# Patient Record
Sex: Female | Born: 1945
Health system: Southern US, Community
[De-identification: ages and names within clinical notes are randomized; demographics above are authoritative.]

## PROBLEM LIST (undated history)

## (undated) DIAGNOSIS — F419 Anxiety disorder, unspecified: Secondary | ICD-10-CM

## (undated) DIAGNOSIS — A150 Tuberculosis of lung: Secondary | ICD-10-CM

## (undated) HISTORY — PX: BREAST BIOPSY: SHX20

## (undated) HISTORY — PX: TOE SURGERY: SHX1073

## (undated) HISTORY — PX: COLONOSCOPY: SHX174

## (undated) HISTORY — PX: BREAST EXCISIONAL BIOPSY: SUR124

## (undated) HISTORY — DX: Tuberculosis of lung: A15.0

## (undated) HISTORY — PX: DILATION AND CURETTAGE OF UTERUS: SHX78

---

## 1999-07-06 ENCOUNTER — Other Ambulatory Visit: Admission: RE | Admit: 1999-07-06 | Discharge: 1999-07-06 | Payer: Self-pay | Admitting: Obstetrics and Gynecology

## 2000-07-10 ENCOUNTER — Other Ambulatory Visit: Admission: RE | Admit: 2000-07-10 | Discharge: 2000-07-10 | Payer: Self-pay | Admitting: Obstetrics and Gynecology

## 2000-07-20 ENCOUNTER — Encounter: Payer: Self-pay | Admitting: *Deleted

## 2000-07-20 ENCOUNTER — Ambulatory Visit: Admission: RE | Admit: 2000-07-20 | Discharge: 2000-07-20 | Payer: Self-pay | Admitting: *Deleted

## 2001-09-06 ENCOUNTER — Other Ambulatory Visit: Admission: RE | Admit: 2001-09-06 | Discharge: 2001-09-06 | Payer: Self-pay | Admitting: Obstetrics and Gynecology

## 2001-09-11 ENCOUNTER — Encounter: Payer: Self-pay | Admitting: Obstetrics and Gynecology

## 2001-09-11 ENCOUNTER — Ambulatory Visit (HOSPITAL_COMMUNITY): Admission: RE | Admit: 2001-09-11 | Discharge: 2001-09-11 | Payer: Self-pay | Admitting: Obstetrics and Gynecology

## 2001-09-11 ENCOUNTER — Encounter: Admission: RE | Admit: 2001-09-11 | Discharge: 2001-09-11 | Payer: Self-pay | Admitting: Obstetrics and Gynecology

## 2003-06-25 ENCOUNTER — Emergency Department (HOSPITAL_COMMUNITY): Admission: EM | Admit: 2003-06-25 | Discharge: 2003-06-26 | Payer: Self-pay | Admitting: Emergency Medicine

## 2003-09-03 ENCOUNTER — Ambulatory Visit (HOSPITAL_COMMUNITY): Admission: RE | Admit: 2003-09-03 | Discharge: 2003-09-03 | Payer: Self-pay | Admitting: *Deleted

## 2003-09-03 ENCOUNTER — Encounter (INDEPENDENT_AMBULATORY_CARE_PROVIDER_SITE_OTHER): Payer: Self-pay | Admitting: Specialist

## 2007-06-06 ENCOUNTER — Encounter: Admission: RE | Admit: 2007-06-06 | Discharge: 2007-06-06 | Payer: Self-pay | Admitting: Obstetrics and Gynecology

## 2007-06-06 ENCOUNTER — Encounter (INDEPENDENT_AMBULATORY_CARE_PROVIDER_SITE_OTHER): Payer: Self-pay | Admitting: Diagnostic Radiology

## 2008-06-05 ENCOUNTER — Encounter: Admission: RE | Admit: 2008-06-05 | Discharge: 2008-06-05 | Payer: Self-pay | Admitting: Obstetrics and Gynecology

## 2009-08-25 ENCOUNTER — Encounter: Admission: RE | Admit: 2009-08-25 | Discharge: 2009-08-25 | Payer: Self-pay | Admitting: Obstetrics and Gynecology

## 2009-11-23 ENCOUNTER — Ambulatory Visit (HOSPITAL_COMMUNITY): Admission: RE | Admit: 2009-11-23 | Discharge: 2009-11-23 | Payer: Self-pay | Admitting: Neurological Surgery

## 2010-09-27 ENCOUNTER — Encounter
Admission: RE | Admit: 2010-09-27 | Discharge: 2010-09-27 | Payer: Self-pay | Source: Home / Self Care | Attending: Obstetrics and Gynecology | Admitting: Obstetrics and Gynecology

## 2011-01-06 LAB — CBC
MCV: 91.2 fL (ref 78.0–100.0)
RBC: 4.02 MIL/uL (ref 3.87–5.11)
WBC: 4.4 10*3/uL (ref 4.0–10.5)

## 2011-09-16 ENCOUNTER — Other Ambulatory Visit: Payer: Self-pay | Admitting: Obstetrics and Gynecology

## 2011-09-16 DIAGNOSIS — Z1231 Encounter for screening mammogram for malignant neoplasm of breast: Secondary | ICD-10-CM

## 2011-11-29 ENCOUNTER — Other Ambulatory Visit: Payer: Self-pay | Admitting: Dermatology

## 2011-11-29 DIAGNOSIS — L851 Acquired keratosis [keratoderma] palmaris et plantaris: Secondary | ICD-10-CM | POA: Diagnosis not present

## 2011-11-29 DIAGNOSIS — L821 Other seborrheic keratosis: Secondary | ICD-10-CM | POA: Diagnosis not present

## 2011-11-29 DIAGNOSIS — L570 Actinic keratosis: Secondary | ICD-10-CM | POA: Diagnosis not present

## 2011-11-29 DIAGNOSIS — D485 Neoplasm of uncertain behavior of skin: Secondary | ICD-10-CM | POA: Diagnosis not present

## 2011-12-16 ENCOUNTER — Other Ambulatory Visit: Payer: Self-pay | Admitting: Obstetrics and Gynecology

## 2011-12-16 DIAGNOSIS — Z780 Asymptomatic menopausal state: Secondary | ICD-10-CM

## 2011-12-30 ENCOUNTER — Other Ambulatory Visit: Payer: Self-pay

## 2011-12-30 ENCOUNTER — Ambulatory Visit: Payer: Self-pay

## 2012-01-09 ENCOUNTER — Ambulatory Visit
Admission: RE | Admit: 2012-01-09 | Discharge: 2012-01-09 | Disposition: A | Discharge status: Home / Self Care | Payer: Medicare Other | Source: Ambulatory Visit | Attending: Obstetrics and Gynecology | Admitting: Obstetrics and Gynecology

## 2012-01-09 DIAGNOSIS — Z1231 Encounter for screening mammogram for malignant neoplasm of breast: Secondary | ICD-10-CM

## 2012-01-09 DIAGNOSIS — Z780 Asymptomatic menopausal state: Secondary | ICD-10-CM

## 2012-01-09 DIAGNOSIS — Z13820 Encounter for screening for osteoporosis: Secondary | ICD-10-CM | POA: Diagnosis not present

## 2012-03-09 DIAGNOSIS — E7800 Pure hypercholesterolemia, unspecified: Secondary | ICD-10-CM | POA: Diagnosis not present

## 2012-03-09 DIAGNOSIS — F329 Major depressive disorder, single episode, unspecified: Secondary | ICD-10-CM | POA: Diagnosis not present

## 2012-03-09 DIAGNOSIS — F411 Generalized anxiety disorder: Secondary | ICD-10-CM | POA: Diagnosis not present

## 2012-03-16 DIAGNOSIS — H524 Presbyopia: Secondary | ICD-10-CM | POA: Diagnosis not present

## 2012-03-16 DIAGNOSIS — H40009 Preglaucoma, unspecified, unspecified eye: Secondary | ICD-10-CM | POA: Diagnosis not present

## 2012-09-10 DIAGNOSIS — Z209 Contact with and (suspected) exposure to unspecified communicable disease: Secondary | ICD-10-CM | POA: Diagnosis not present

## 2012-09-10 DIAGNOSIS — W278XXA Contact with other nonpowered hand tool, initial encounter: Secondary | ICD-10-CM | POA: Diagnosis not present

## 2012-09-10 DIAGNOSIS — Z23 Encounter for immunization: Secondary | ICD-10-CM | POA: Diagnosis not present

## 2013-01-22 DIAGNOSIS — F411 Generalized anxiety disorder: Secondary | ICD-10-CM | POA: Diagnosis not present

## 2013-01-22 DIAGNOSIS — Z209 Contact with and (suspected) exposure to unspecified communicable disease: Secondary | ICD-10-CM | POA: Diagnosis not present

## 2013-01-22 DIAGNOSIS — Z0000 Encounter for general adult medical examination without abnormal findings: Secondary | ICD-10-CM | POA: Diagnosis not present

## 2013-01-22 DIAGNOSIS — F329 Major depressive disorder, single episode, unspecified: Secondary | ICD-10-CM | POA: Diagnosis not present

## 2013-01-22 DIAGNOSIS — E785 Hyperlipidemia, unspecified: Secondary | ICD-10-CM | POA: Diagnosis not present

## 2013-01-22 DIAGNOSIS — F3289 Other specified depressive episodes: Secondary | ICD-10-CM | POA: Diagnosis not present

## 2013-02-25 ENCOUNTER — Other Ambulatory Visit: Payer: Self-pay

## 2013-02-25 DIAGNOSIS — Z1231 Encounter for screening mammogram for malignant neoplasm of breast: Secondary | ICD-10-CM

## 2013-03-20 DIAGNOSIS — H524 Presbyopia: Secondary | ICD-10-CM | POA: Diagnosis not present

## 2013-03-20 DIAGNOSIS — H40009 Preglaucoma, unspecified, unspecified eye: Secondary | ICD-10-CM | POA: Diagnosis not present

## 2013-03-25 DIAGNOSIS — F329 Major depressive disorder, single episode, unspecified: Secondary | ICD-10-CM | POA: Diagnosis not present

## 2013-03-25 DIAGNOSIS — Z23 Encounter for immunization: Secondary | ICD-10-CM | POA: Diagnosis not present

## 2013-03-25 DIAGNOSIS — E7800 Pure hypercholesterolemia, unspecified: Secondary | ICD-10-CM | POA: Diagnosis not present

## 2013-03-25 DIAGNOSIS — Z209 Contact with and (suspected) exposure to unspecified communicable disease: Secondary | ICD-10-CM | POA: Diagnosis not present

## 2013-03-29 ENCOUNTER — Ambulatory Visit
Admission: RE | Admit: 2013-03-29 | Discharge: 2013-03-29 | Disposition: A | Discharge status: Home / Self Care | Payer: Medicare Other | Source: Ambulatory Visit

## 2013-03-29 DIAGNOSIS — Z1231 Encounter for screening mammogram for malignant neoplasm of breast: Secondary | ICD-10-CM

## 2013-04-02 ENCOUNTER — Other Ambulatory Visit: Payer: Self-pay | Admitting: Obstetrics and Gynecology

## 2013-04-02 DIAGNOSIS — R928 Other abnormal and inconclusive findings on diagnostic imaging of breast: Secondary | ICD-10-CM

## 2013-04-10 ENCOUNTER — Ambulatory Visit
Admission: RE | Admit: 2013-04-10 | Discharge: 2013-04-10 | Disposition: A | Discharge status: Home / Self Care | Payer: Medicare Other | Source: Ambulatory Visit | Attending: Obstetrics and Gynecology | Admitting: Obstetrics and Gynecology

## 2013-04-10 DIAGNOSIS — R928 Other abnormal and inconclusive findings on diagnostic imaging of breast: Secondary | ICD-10-CM | POA: Diagnosis not present

## 2013-07-30 DIAGNOSIS — L820 Inflamed seborrheic keratosis: Secondary | ICD-10-CM | POA: Diagnosis not present

## 2013-07-30 DIAGNOSIS — D1801 Hemangioma of skin and subcutaneous tissue: Secondary | ICD-10-CM | POA: Diagnosis not present

## 2013-07-30 DIAGNOSIS — L821 Other seborrheic keratosis: Secondary | ICD-10-CM | POA: Diagnosis not present

## 2013-07-30 DIAGNOSIS — L570 Actinic keratosis: Secondary | ICD-10-CM | POA: Diagnosis not present

## 2013-07-30 DIAGNOSIS — L723 Sebaceous cyst: Secondary | ICD-10-CM | POA: Diagnosis not present

## 2013-09-05 DIAGNOSIS — Z0000 Encounter for general adult medical examination without abnormal findings: Secondary | ICD-10-CM | POA: Diagnosis not present

## 2013-09-05 DIAGNOSIS — Z124 Encounter for screening for malignant neoplasm of cervix: Secondary | ICD-10-CM | POA: Diagnosis not present

## 2013-09-05 DIAGNOSIS — Z01419 Encounter for gynecological examination (general) (routine) without abnormal findings: Secondary | ICD-10-CM | POA: Diagnosis not present

## 2013-11-19 ENCOUNTER — Other Ambulatory Visit: Payer: Self-pay | Admitting: Gastroenterology

## 2013-11-19 DIAGNOSIS — Z86010 Personal history of colonic polyps: Secondary | ICD-10-CM | POA: Diagnosis not present

## 2013-11-19 DIAGNOSIS — D126 Benign neoplasm of colon, unspecified: Secondary | ICD-10-CM | POA: Diagnosis not present

## 2013-11-19 DIAGNOSIS — Z09 Encounter for follow-up examination after completed treatment for conditions other than malignant neoplasm: Secondary | ICD-10-CM | POA: Diagnosis not present

## 2013-11-19 DIAGNOSIS — K648 Other hemorrhoids: Secondary | ICD-10-CM | POA: Diagnosis not present

## 2014-01-28 DIAGNOSIS — M2240 Chondromalacia patellae, unspecified knee: Secondary | ICD-10-CM | POA: Diagnosis not present

## 2014-02-05 DIAGNOSIS — Z1331 Encounter for screening for depression: Secondary | ICD-10-CM | POA: Diagnosis not present

## 2014-02-05 DIAGNOSIS — Z0000 Encounter for general adult medical examination without abnormal findings: Secondary | ICD-10-CM | POA: Diagnosis not present

## 2014-02-05 DIAGNOSIS — F411 Generalized anxiety disorder: Secondary | ICD-10-CM | POA: Diagnosis not present

## 2014-02-05 DIAGNOSIS — E785 Hyperlipidemia, unspecified: Secondary | ICD-10-CM | POA: Diagnosis not present

## 2014-03-20 DIAGNOSIS — H2510 Age-related nuclear cataract, unspecified eye: Secondary | ICD-10-CM | POA: Diagnosis not present

## 2014-05-07 DIAGNOSIS — H9319 Tinnitus, unspecified ear: Secondary | ICD-10-CM | POA: Diagnosis not present

## 2014-05-07 DIAGNOSIS — H60339 Swimmer's ear, unspecified ear: Secondary | ICD-10-CM | POA: Diagnosis not present

## 2014-06-05 ENCOUNTER — Other Ambulatory Visit: Payer: Self-pay

## 2014-06-05 DIAGNOSIS — Z1231 Encounter for screening mammogram for malignant neoplasm of breast: Secondary | ICD-10-CM

## 2014-06-11 DIAGNOSIS — T6391XA Toxic effect of contact with unspecified venomous animal, accidental (unintentional), initial encounter: Secondary | ICD-10-CM | POA: Diagnosis not present

## 2014-06-11 DIAGNOSIS — H608X9 Other otitis externa, unspecified ear: Secondary | ICD-10-CM | POA: Diagnosis not present

## 2014-06-11 DIAGNOSIS — H9319 Tinnitus, unspecified ear: Secondary | ICD-10-CM | POA: Diagnosis not present

## 2014-06-12 ENCOUNTER — Ambulatory Visit
Admission: RE | Admit: 2014-06-12 | Discharge: 2014-06-12 | Disposition: A | Discharge status: Home / Self Care | Payer: Medicare Other | Source: Ambulatory Visit

## 2014-06-12 DIAGNOSIS — Z1231 Encounter for screening mammogram for malignant neoplasm of breast: Secondary | ICD-10-CM

## 2014-07-24 DIAGNOSIS — M25511 Pain in right shoulder: Secondary | ICD-10-CM | POA: Diagnosis not present

## 2014-07-24 DIAGNOSIS — M5010 Cervical disc disorder with radiculopathy, unspecified cervical region: Secondary | ICD-10-CM | POA: Diagnosis not present

## 2014-08-04 DIAGNOSIS — M47812 Spondylosis without myelopathy or radiculopathy, cervical region: Secondary | ICD-10-CM | POA: Diagnosis not present

## 2014-08-06 DIAGNOSIS — M5010 Cervical disc disorder with radiculopathy, unspecified cervical region: Secondary | ICD-10-CM | POA: Diagnosis not present

## 2014-08-06 DIAGNOSIS — M25511 Pain in right shoulder: Secondary | ICD-10-CM | POA: Diagnosis not present

## 2014-08-06 DIAGNOSIS — M47812 Spondylosis without myelopathy or radiculopathy, cervical region: Secondary | ICD-10-CM | POA: Diagnosis not present

## 2014-08-15 DIAGNOSIS — Z6825 Body mass index (BMI) 25.0-25.9, adult: Secondary | ICD-10-CM | POA: Diagnosis not present

## 2014-08-15 DIAGNOSIS — M542 Cervicalgia: Secondary | ICD-10-CM | POA: Diagnosis not present

## 2014-08-21 DIAGNOSIS — L821 Other seborrheic keratosis: Secondary | ICD-10-CM | POA: Diagnosis not present

## 2014-08-21 DIAGNOSIS — L570 Actinic keratosis: Secondary | ICD-10-CM | POA: Diagnosis not present

## 2014-08-21 DIAGNOSIS — D1801 Hemangioma of skin and subcutaneous tissue: Secondary | ICD-10-CM | POA: Diagnosis not present

## 2015-02-23 DIAGNOSIS — R51 Headache: Secondary | ICD-10-CM | POA: Diagnosis not present

## 2015-02-23 DIAGNOSIS — Z4802 Encounter for removal of sutures: Secondary | ICD-10-CM | POA: Diagnosis not present

## 2015-02-23 DIAGNOSIS — F419 Anxiety disorder, unspecified: Secondary | ICD-10-CM | POA: Diagnosis not present

## 2015-07-13 DIAGNOSIS — F419 Anxiety disorder, unspecified: Secondary | ICD-10-CM | POA: Diagnosis not present

## 2015-07-13 DIAGNOSIS — E780 Pure hypercholesterolemia: Secondary | ICD-10-CM | POA: Diagnosis not present

## 2015-07-13 DIAGNOSIS — Z0000 Encounter for general adult medical examination without abnormal findings: Secondary | ICD-10-CM | POA: Diagnosis not present

## 2015-07-13 DIAGNOSIS — Z1389 Encounter for screening for other disorder: Secondary | ICD-10-CM | POA: Diagnosis not present

## 2015-07-13 DIAGNOSIS — Z23 Encounter for immunization: Secondary | ICD-10-CM | POA: Diagnosis not present

## 2015-07-20 DIAGNOSIS — L904 Acrodermatitis chronica atrophicans: Secondary | ICD-10-CM | POA: Diagnosis not present

## 2015-07-20 DIAGNOSIS — N952 Postmenopausal atrophic vaginitis: Secondary | ICD-10-CM | POA: Diagnosis not present

## 2015-08-02 IMAGING — MG MM SCREENING BREAST TOMO BILATERAL
8 series · 9 of 24 positions shown · non-contrast
Comparison: Previous exam(s).

CLINICAL DATA: Screening.

EXAM:
DIGITAL SCREENING BILATERAL MAMMOGRAM WITH 3D TOMO WITH CAD

[L CC]
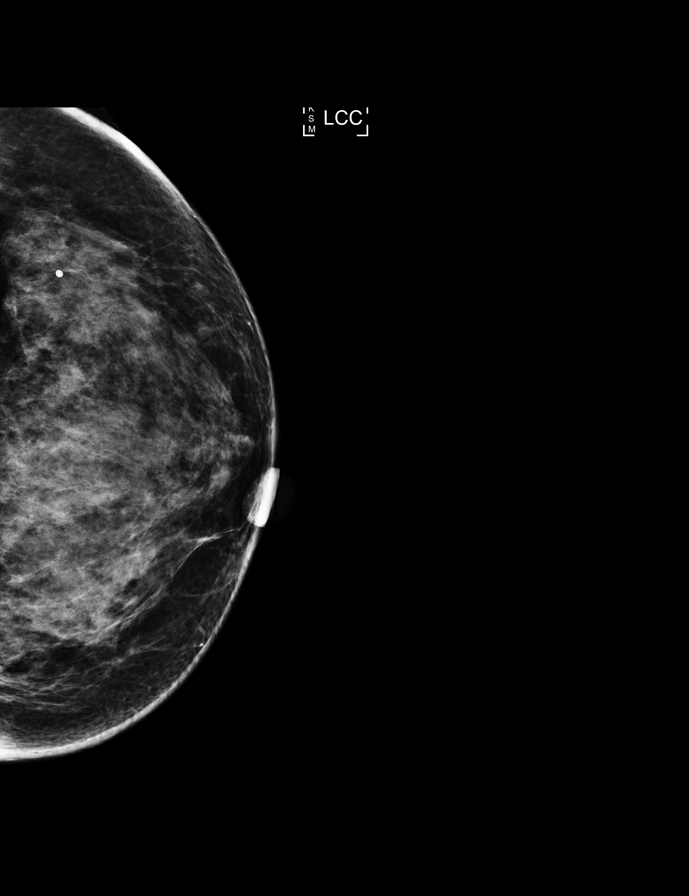

[R MLO]
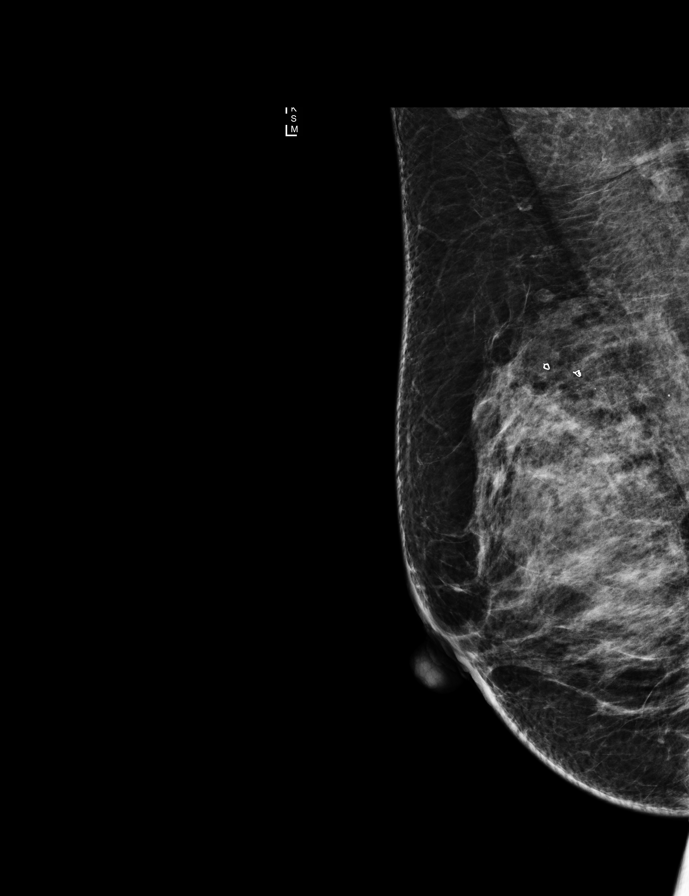

[L MLO]
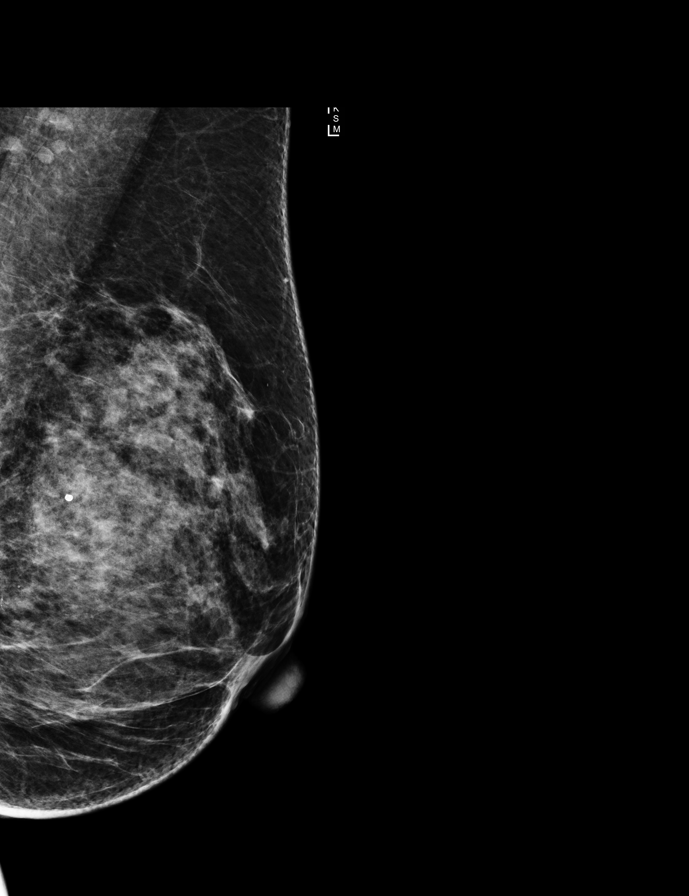

[R CC]
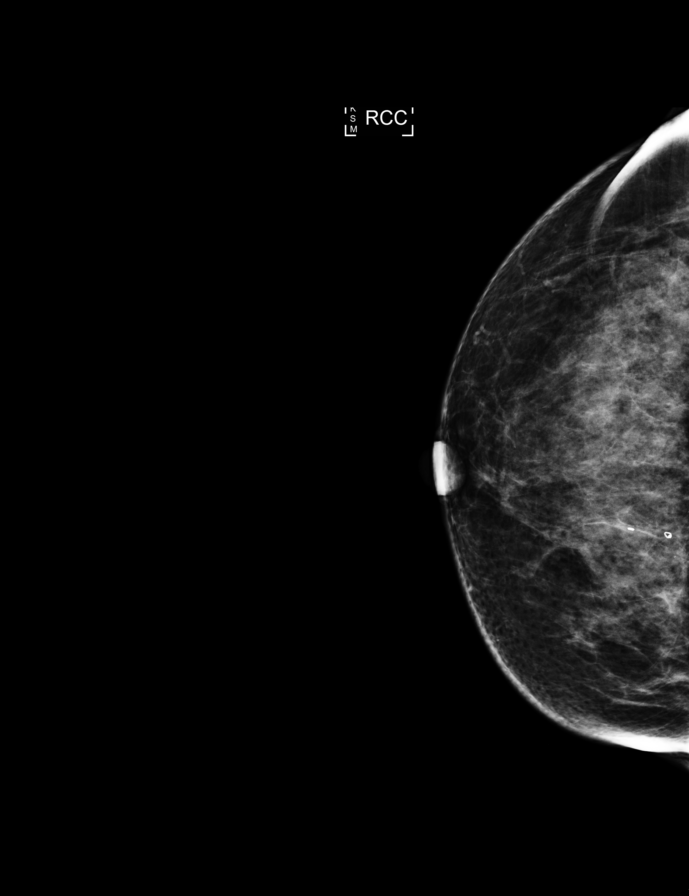

[R MLO tomo · 2 of 70 frames shown]
[frame 23/70]
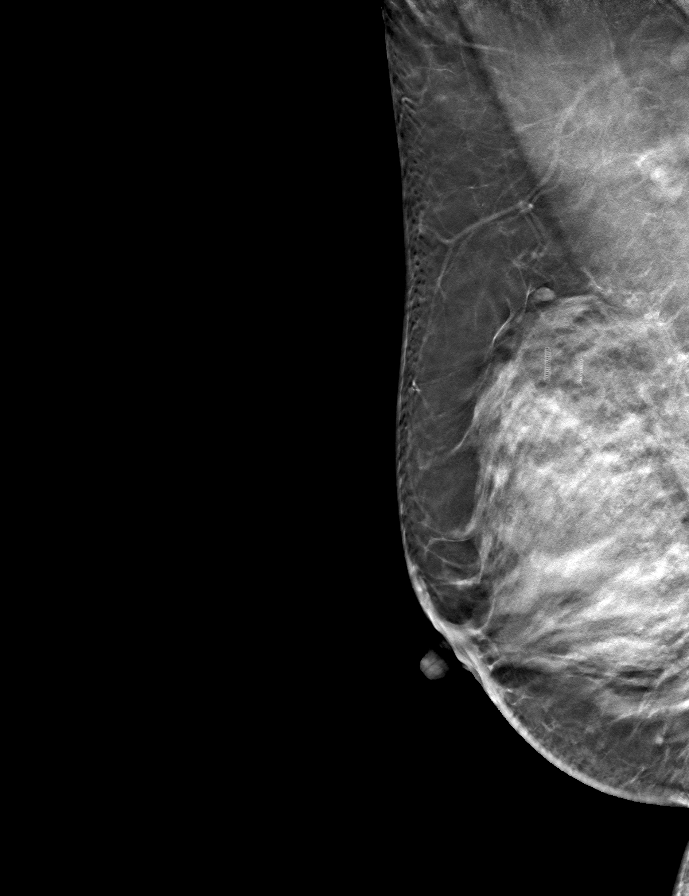
[frame 35/70]
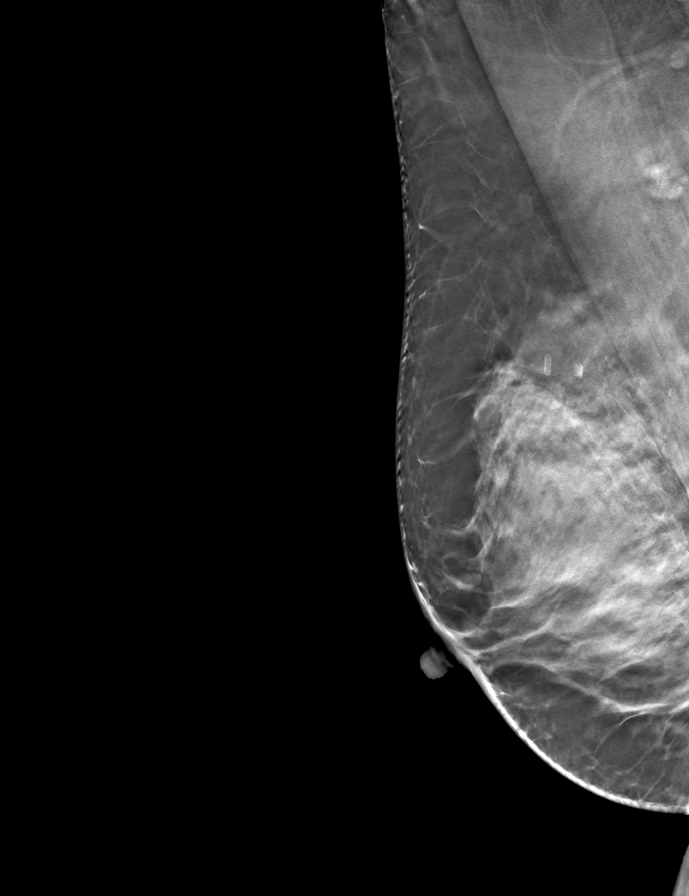

[R CC tomo · tomo slice 39/77.0]
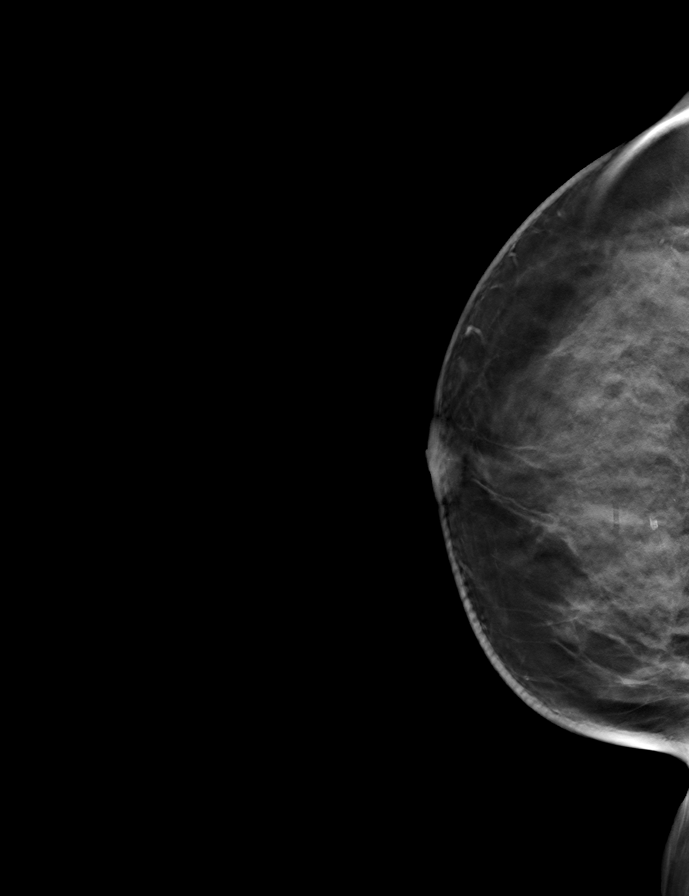

[L MLO tomo · tomo slice 33/66.0]
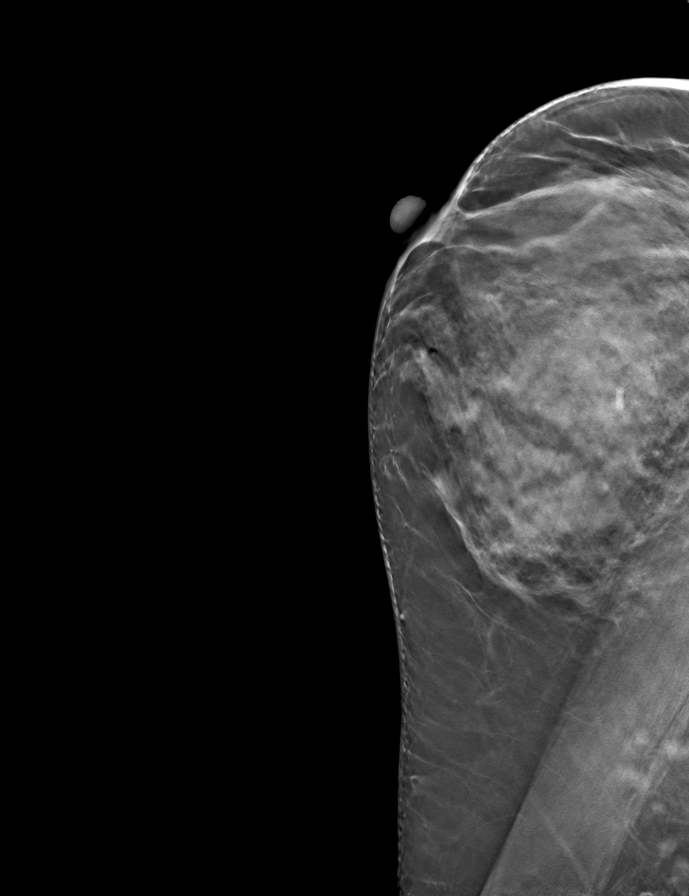

[L CC tomo · tomo slice 37/73.0]
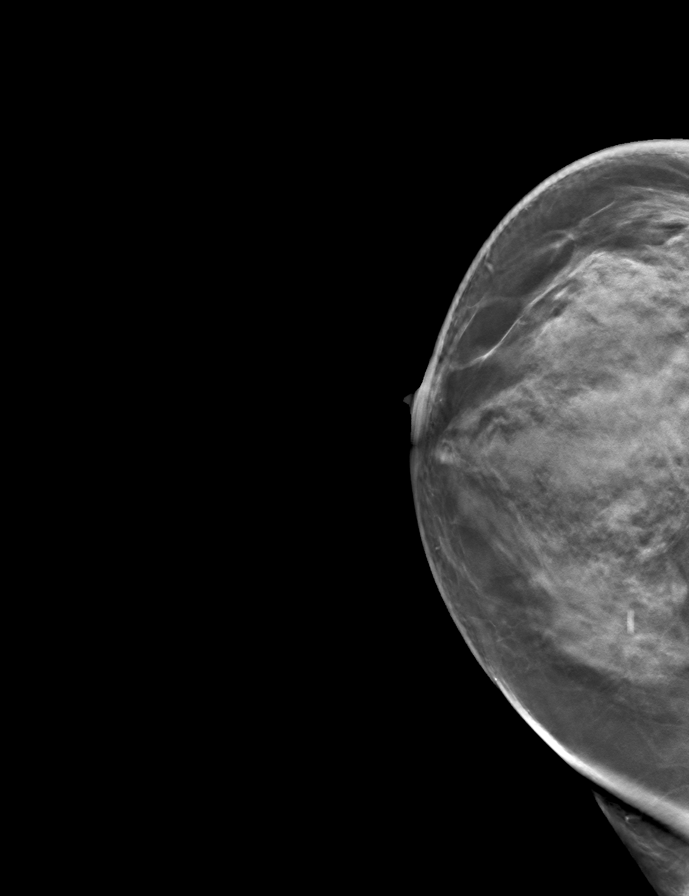

[9 of 24 positions shown; findings below may reference images not displayed]

ACR Breast Density Category d: The breast tissue is extremely dense,
which lowers the sensitivity of mammography.
FINDINGS: There are no findings suspicious for malignancy. Images were
processed with CAD.
IMPRESSION: No mammographic evidence of malignancy. A result letter of this
screening mammogram will be mailed directly to the patient.

RECOMMENDATION:
Screening mammogram in one year. (Code:JD-S-SN3)

BI-RADS CATEGORY  1: Negative.

## 2015-08-24 DIAGNOSIS — L821 Other seborrheic keratosis: Secondary | ICD-10-CM | POA: Diagnosis not present

## 2015-08-24 DIAGNOSIS — L570 Actinic keratosis: Secondary | ICD-10-CM | POA: Diagnosis not present

## 2015-08-24 DIAGNOSIS — D1801 Hemangioma of skin and subcutaneous tissue: Secondary | ICD-10-CM | POA: Diagnosis not present

## 2015-09-08 DIAGNOSIS — D649 Anemia, unspecified: Secondary | ICD-10-CM | POA: Diagnosis not present

## 2015-09-08 DIAGNOSIS — Z130 Encounter for screening for diseases of the blood and blood-forming organs and certain disorders involving the immune mechanism: Secondary | ICD-10-CM | POA: Diagnosis not present

## 2015-09-08 DIAGNOSIS — Z01419 Encounter for gynecological examination (general) (routine) without abnormal findings: Secondary | ICD-10-CM | POA: Diagnosis not present

## 2015-09-08 DIAGNOSIS — Z124 Encounter for screening for malignant neoplasm of cervix: Secondary | ICD-10-CM | POA: Diagnosis not present

## 2015-10-15 ENCOUNTER — Other Ambulatory Visit: Payer: Self-pay | Admitting: Family Medicine

## 2015-10-16 ENCOUNTER — Other Ambulatory Visit: Payer: Self-pay | Admitting: Family Medicine

## 2015-10-16 DIAGNOSIS — E2839 Other primary ovarian failure: Secondary | ICD-10-CM

## 2015-10-16 DIAGNOSIS — Z1231 Encounter for screening mammogram for malignant neoplasm of breast: Secondary | ICD-10-CM

## 2015-11-17 ENCOUNTER — Ambulatory Visit
Admission: RE | Admit: 2015-11-17 | Discharge: 2015-11-17 | Disposition: A | Discharge status: Home / Self Care | Payer: Medicare Other | Source: Ambulatory Visit | Attending: Family Medicine | Admitting: Family Medicine

## 2015-11-17 DIAGNOSIS — E2839 Other primary ovarian failure: Secondary | ICD-10-CM

## 2015-11-17 DIAGNOSIS — M8588 Other specified disorders of bone density and structure, other site: Secondary | ICD-10-CM | POA: Diagnosis not present

## 2015-11-17 DIAGNOSIS — Z1231 Encounter for screening mammogram for malignant neoplasm of breast: Secondary | ICD-10-CM

## 2015-12-09 DIAGNOSIS — L720 Epidermal cyst: Secondary | ICD-10-CM | POA: Diagnosis not present

## 2015-12-09 DIAGNOSIS — L2089 Other atopic dermatitis: Secondary | ICD-10-CM | POA: Diagnosis not present

## 2015-12-09 DIAGNOSIS — L821 Other seborrheic keratosis: Secondary | ICD-10-CM | POA: Diagnosis not present

## 2015-12-09 DIAGNOSIS — L570 Actinic keratosis: Secondary | ICD-10-CM | POA: Diagnosis not present

## 2016-01-25 DIAGNOSIS — F419 Anxiety disorder, unspecified: Secondary | ICD-10-CM | POA: Diagnosis not present

## 2016-01-25 DIAGNOSIS — A09 Infectious gastroenteritis and colitis, unspecified: Secondary | ICD-10-CM | POA: Diagnosis not present

## 2016-01-25 DIAGNOSIS — G4725 Circadian rhythm sleep disorder, jet lag type: Secondary | ICD-10-CM | POA: Diagnosis not present

## 2016-03-07 DIAGNOSIS — J069 Acute upper respiratory infection, unspecified: Secondary | ICD-10-CM | POA: Diagnosis not present

## 2016-03-18 DIAGNOSIS — H2513 Age-related nuclear cataract, bilateral: Secondary | ICD-10-CM | POA: Diagnosis not present

## 2016-03-18 DIAGNOSIS — H524 Presbyopia: Secondary | ICD-10-CM | POA: Diagnosis not present

## 2016-03-18 DIAGNOSIS — H5203 Hypermetropia, bilateral: Secondary | ICD-10-CM | POA: Diagnosis not present

## 2016-06-14 DIAGNOSIS — R194 Change in bowel habit: Secondary | ICD-10-CM | POA: Diagnosis not present

## 2016-06-14 DIAGNOSIS — K648 Other hemorrhoids: Secondary | ICD-10-CM | POA: Diagnosis not present

## 2016-07-04 DIAGNOSIS — M2241 Chondromalacia patellae, right knee: Secondary | ICD-10-CM | POA: Diagnosis not present

## 2016-07-04 DIAGNOSIS — M545 Low back pain: Secondary | ICD-10-CM | POA: Diagnosis not present

## 2016-07-04 DIAGNOSIS — M7061 Trochanteric bursitis, right hip: Secondary | ICD-10-CM | POA: Diagnosis not present

## 2016-08-23 DIAGNOSIS — B078 Other viral warts: Secondary | ICD-10-CM | POA: Diagnosis not present

## 2016-08-23 DIAGNOSIS — D1801 Hemangioma of skin and subcutaneous tissue: Secondary | ICD-10-CM | POA: Diagnosis not present

## 2016-08-23 DIAGNOSIS — L821 Other seborrheic keratosis: Secondary | ICD-10-CM | POA: Diagnosis not present

## 2016-09-12 DIAGNOSIS — Z23 Encounter for immunization: Secondary | ICD-10-CM | POA: Diagnosis not present

## 2016-09-12 DIAGNOSIS — F419 Anxiety disorder, unspecified: Secondary | ICD-10-CM | POA: Diagnosis not present

## 2016-09-12 DIAGNOSIS — E7800 Pure hypercholesterolemia, unspecified: Secondary | ICD-10-CM | POA: Diagnosis not present

## 2016-09-12 DIAGNOSIS — Z1389 Encounter for screening for other disorder: Secondary | ICD-10-CM | POA: Diagnosis not present

## 2016-09-12 DIAGNOSIS — Z0000 Encounter for general adult medical examination without abnormal findings: Secondary | ICD-10-CM | POA: Diagnosis not present

## 2016-09-21 DIAGNOSIS — B078 Other viral warts: Secondary | ICD-10-CM | POA: Diagnosis not present

## 2016-10-25 DIAGNOSIS — B078 Other viral warts: Secondary | ICD-10-CM | POA: Diagnosis not present

## 2016-11-22 ENCOUNTER — Ambulatory Visit (INDEPENDENT_AMBULATORY_CARE_PROVIDER_SITE_OTHER): Payer: Medicare Other

## 2016-11-22 ENCOUNTER — Encounter: Payer: Self-pay | Admitting: Podiatry

## 2016-11-22 ENCOUNTER — Ambulatory Visit (INDEPENDENT_AMBULATORY_CARE_PROVIDER_SITE_OTHER): Payer: Medicare Other | Admitting: Podiatry

## 2016-11-22 VITALS — BP 109/59 | HR 56 | Resp 16

## 2016-11-22 DIAGNOSIS — G5792 Unspecified mononeuropathy of left lower limb: Secondary | ICD-10-CM

## 2016-11-22 DIAGNOSIS — S9032XA Contusion of left foot, initial encounter: Secondary | ICD-10-CM | POA: Diagnosis not present

## 2016-11-22 DIAGNOSIS — M79672 Pain in left foot: Secondary | ICD-10-CM

## 2016-11-22 NOTE — Progress Notes (Signed)
   Subjective:    Patient ID: Maria Booth, female    DOB: 06-18-1946, 71 y.o.   MRN: [id number]  HPI: She presents today with chief complaint of pain to the fifth metatarsal base of the left foot. States that yesterday she fell asleep in the chair with her foot under her leg as she sat on it and when she went to stand up she noticed her foot was completely numb looked down and she was standing on the side of the foot. She states this at that time it has been somewhat numb but tender and painful on the fifth metatarsal base as she points to the left foot. Denies any other trauma falls.  Review of Systems  Musculoskeletal: Positive for gait problem.  All other systems reviewed and are negative.      Objective:   Physical Exam: Vital signs are stable alert and oriented 3. Pulses are palpable. Neurologic sensorium is intact. Deep tendon reflexes are intact. Muscle strength is normal. She is mild tenderness on palpation of the fifth metatarsal base. Radiographs do not demonstrate any type of osseus abnormalities and tendons appear to be intact with little or no tenderness on palpation.  Assessment: Contusion fifth metatarsal left foot.  Plan: Follow up with me with any other questions otherwise good supportive shoe gear will be necessary for the next few days.        Assessment & Plan:

## 2017-01-10 ENCOUNTER — Other Ambulatory Visit: Payer: Self-pay | Admitting: Obstetrics and Gynecology

## 2017-01-10 DIAGNOSIS — Z1231 Encounter for screening mammogram for malignant neoplasm of breast: Secondary | ICD-10-CM

## 2017-01-30 ENCOUNTER — Ambulatory Visit
Admission: RE | Admit: 2017-01-30 | Discharge: 2017-01-30 | Disposition: A | Discharge status: Home / Self Care | Payer: Medicare Other | Source: Ambulatory Visit | Attending: Obstetrics and Gynecology | Admitting: Obstetrics and Gynecology

## 2017-01-30 DIAGNOSIS — Z1231 Encounter for screening mammogram for malignant neoplasm of breast: Secondary | ICD-10-CM

## 2017-02-01 ENCOUNTER — Other Ambulatory Visit: Payer: Self-pay | Admitting: Obstetrics and Gynecology

## 2017-02-01 DIAGNOSIS — R928 Other abnormal and inconclusive findings on diagnostic imaging of breast: Secondary | ICD-10-CM

## 2017-02-08 ENCOUNTER — Ambulatory Visit
Admission: RE | Admit: 2017-02-08 | Discharge: 2017-02-08 | Disposition: A | Discharge status: Home / Self Care | Payer: Medicare Other | Source: Ambulatory Visit | Attending: Obstetrics and Gynecology | Admitting: Obstetrics and Gynecology

## 2017-02-08 DIAGNOSIS — R928 Other abnormal and inconclusive findings on diagnostic imaging of breast: Secondary | ICD-10-CM | POA: Diagnosis not present

## 2017-02-08 DIAGNOSIS — N6489 Other specified disorders of breast: Secondary | ICD-10-CM | POA: Diagnosis not present

## 2017-03-31 DIAGNOSIS — H5203 Hypermetropia, bilateral: Secondary | ICD-10-CM | POA: Diagnosis not present

## 2017-06-20 DIAGNOSIS — L820 Inflamed seborrheic keratosis: Secondary | ICD-10-CM | POA: Diagnosis not present

## 2017-06-20 DIAGNOSIS — L237 Allergic contact dermatitis due to plants, except food: Secondary | ICD-10-CM | POA: Diagnosis not present

## 2017-06-20 DIAGNOSIS — N9089 Other specified noninflammatory disorders of vulva and perineum: Secondary | ICD-10-CM | POA: Diagnosis not present

## 2017-08-28 DIAGNOSIS — L820 Inflamed seborrheic keratosis: Secondary | ICD-10-CM | POA: Diagnosis not present

## 2017-08-28 DIAGNOSIS — L821 Other seborrheic keratosis: Secondary | ICD-10-CM | POA: Diagnosis not present

## 2017-08-28 DIAGNOSIS — L438 Other lichen planus: Secondary | ICD-10-CM | POA: Diagnosis not present

## 2017-08-28 DIAGNOSIS — L720 Epidermal cyst: Secondary | ICD-10-CM | POA: Diagnosis not present

## 2017-08-28 DIAGNOSIS — D1801 Hemangioma of skin and subcutaneous tissue: Secondary | ICD-10-CM | POA: Diagnosis not present

## 2017-09-14 DIAGNOSIS — E7800 Pure hypercholesterolemia, unspecified: Secondary | ICD-10-CM | POA: Diagnosis not present

## 2017-09-14 DIAGNOSIS — Z23 Encounter for immunization: Secondary | ICD-10-CM | POA: Diagnosis not present

## 2017-09-14 DIAGNOSIS — F419 Anxiety disorder, unspecified: Secondary | ICD-10-CM | POA: Diagnosis not present

## 2017-09-14 DIAGNOSIS — Z0000 Encounter for general adult medical examination without abnormal findings: Secondary | ICD-10-CM | POA: Diagnosis not present

## 2017-09-14 DIAGNOSIS — Z1389 Encounter for screening for other disorder: Secondary | ICD-10-CM | POA: Diagnosis not present

## 2018-02-07 DIAGNOSIS — Z6824 Body mass index (BMI) 24.0-24.9, adult: Secondary | ICD-10-CM | POA: Diagnosis not present

## 2018-02-07 DIAGNOSIS — Z124 Encounter for screening for malignant neoplasm of cervix: Secondary | ICD-10-CM | POA: Diagnosis not present

## 2018-03-29 ENCOUNTER — Other Ambulatory Visit: Payer: Self-pay | Admitting: Obstetrics and Gynecology

## 2018-03-29 DIAGNOSIS — Z1231 Encounter for screening mammogram for malignant neoplasm of breast: Secondary | ICD-10-CM

## 2018-04-23 ENCOUNTER — Ambulatory Visit
Admission: RE | Admit: 2018-04-23 | Discharge: 2018-04-23 | Disposition: A | Discharge status: Home / Self Care | Payer: Medicare Other | Source: Ambulatory Visit | Attending: Obstetrics and Gynecology | Admitting: Obstetrics and Gynecology

## 2018-04-23 DIAGNOSIS — Z1231 Encounter for screening mammogram for malignant neoplasm of breast: Secondary | ICD-10-CM

## 2018-04-24 DIAGNOSIS — M25561 Pain in right knee: Secondary | ICD-10-CM | POA: Diagnosis not present

## 2018-04-24 DIAGNOSIS — M545 Low back pain: Secondary | ICD-10-CM | POA: Diagnosis not present

## 2018-04-27 DIAGNOSIS — M545 Low back pain: Secondary | ICD-10-CM | POA: Diagnosis not present

## 2018-05-17 DIAGNOSIS — M5416 Radiculopathy, lumbar region: Secondary | ICD-10-CM | POA: Diagnosis not present

## 2018-06-21 DIAGNOSIS — H2513 Age-related nuclear cataract, bilateral: Secondary | ICD-10-CM | POA: Diagnosis not present

## 2018-08-07 DIAGNOSIS — L299 Pruritus, unspecified: Secondary | ICD-10-CM | POA: Diagnosis not present

## 2018-08-07 DIAGNOSIS — S60862A Insect bite (nonvenomous) of left wrist, initial encounter: Secondary | ICD-10-CM | POA: Diagnosis not present

## 2018-08-28 DIAGNOSIS — L570 Actinic keratosis: Secondary | ICD-10-CM | POA: Diagnosis not present

## 2018-08-28 DIAGNOSIS — L821 Other seborrheic keratosis: Secondary | ICD-10-CM | POA: Diagnosis not present

## 2018-09-17 DIAGNOSIS — Z23 Encounter for immunization: Secondary | ICD-10-CM | POA: Diagnosis not present

## 2018-09-17 DIAGNOSIS — Z1389 Encounter for screening for other disorder: Secondary | ICD-10-CM | POA: Diagnosis not present

## 2018-09-17 DIAGNOSIS — Z0000 Encounter for general adult medical examination without abnormal findings: Secondary | ICD-10-CM | POA: Diagnosis not present

## 2018-09-17 DIAGNOSIS — E7800 Pure hypercholesterolemia, unspecified: Secondary | ICD-10-CM | POA: Diagnosis not present

## 2018-09-17 DIAGNOSIS — F419 Anxiety disorder, unspecified: Secondary | ICD-10-CM | POA: Diagnosis not present

## 2018-09-17 DIAGNOSIS — Z6825 Body mass index (BMI) 25.0-25.9, adult: Secondary | ICD-10-CM | POA: Diagnosis not present

## 2018-09-17 DIAGNOSIS — Z1211 Encounter for screening for malignant neoplasm of colon: Secondary | ICD-10-CM | POA: Diagnosis not present

## 2018-10-31 ENCOUNTER — Other Ambulatory Visit: Payer: Self-pay | Admitting: Family Medicine

## 2018-10-31 DIAGNOSIS — E2839 Other primary ovarian failure: Secondary | ICD-10-CM

## 2018-12-12 ENCOUNTER — Ambulatory Visit
Admission: RE | Admit: 2018-12-12 | Discharge: 2018-12-12 | Disposition: A | Discharge status: Home / Self Care | Payer: Medicare Other | Source: Ambulatory Visit | Attending: Family Medicine | Admitting: Family Medicine

## 2018-12-12 DIAGNOSIS — E2839 Other primary ovarian failure: Secondary | ICD-10-CM

## 2018-12-12 DIAGNOSIS — Z780 Asymptomatic menopausal state: Secondary | ICD-10-CM | POA: Diagnosis not present

## 2018-12-12 DIAGNOSIS — Z13820 Encounter for screening for osteoporosis: Secondary | ICD-10-CM | POA: Diagnosis not present

## 2018-12-17 DIAGNOSIS — H04123 Dry eye syndrome of bilateral lacrimal glands: Secondary | ICD-10-CM | POA: Diagnosis not present

## 2018-12-17 DIAGNOSIS — H2513 Age-related nuclear cataract, bilateral: Secondary | ICD-10-CM | POA: Diagnosis not present

## 2018-12-17 DIAGNOSIS — H40023 Open angle with borderline findings, high risk, bilateral: Secondary | ICD-10-CM | POA: Diagnosis not present

## 2018-12-28 DIAGNOSIS — K640 First degree hemorrhoids: Secondary | ICD-10-CM | POA: Diagnosis not present

## 2018-12-28 DIAGNOSIS — Z86010 Personal history of colonic polyps: Secondary | ICD-10-CM | POA: Diagnosis not present

## 2018-12-31 DIAGNOSIS — H04123 Dry eye syndrome of bilateral lacrimal glands: Secondary | ICD-10-CM | POA: Diagnosis not present

## 2018-12-31 DIAGNOSIS — H2513 Age-related nuclear cataract, bilateral: Secondary | ICD-10-CM | POA: Diagnosis not present

## 2018-12-31 DIAGNOSIS — H40023 Open angle with borderline findings, high risk, bilateral: Secondary | ICD-10-CM | POA: Diagnosis not present

## 2019-03-07 DIAGNOSIS — H2512 Age-related nuclear cataract, left eye: Secondary | ICD-10-CM | POA: Diagnosis not present

## 2019-03-07 DIAGNOSIS — H52212 Irregular astigmatism, left eye: Secondary | ICD-10-CM | POA: Diagnosis not present

## 2019-03-18 DIAGNOSIS — H2511 Age-related nuclear cataract, right eye: Secondary | ICD-10-CM | POA: Diagnosis not present

## 2019-03-21 DIAGNOSIS — H2511 Age-related nuclear cataract, right eye: Secondary | ICD-10-CM | POA: Diagnosis not present

## 2019-03-21 DIAGNOSIS — H52211 Irregular astigmatism, right eye: Secondary | ICD-10-CM | POA: Diagnosis not present

## 2019-06-28 ENCOUNTER — Other Ambulatory Visit: Payer: Self-pay | Admitting: Obstetrics and Gynecology

## 2019-06-28 DIAGNOSIS — Z1231 Encounter for screening mammogram for malignant neoplasm of breast: Secondary | ICD-10-CM

## 2019-07-29 DIAGNOSIS — M9901 Segmental and somatic dysfunction of cervical region: Secondary | ICD-10-CM | POA: Diagnosis not present

## 2019-07-29 DIAGNOSIS — M5411 Radiculopathy, occipito-atlanto-axial region: Secondary | ICD-10-CM | POA: Diagnosis not present

## 2019-07-30 DIAGNOSIS — M9901 Segmental and somatic dysfunction of cervical region: Secondary | ICD-10-CM | POA: Diagnosis not present

## 2019-07-30 DIAGNOSIS — M5411 Radiculopathy, occipito-atlanto-axial region: Secondary | ICD-10-CM | POA: Diagnosis not present

## 2019-08-01 DIAGNOSIS — M9901 Segmental and somatic dysfunction of cervical region: Secondary | ICD-10-CM | POA: Diagnosis not present

## 2019-08-01 DIAGNOSIS — M5411 Radiculopathy, occipito-atlanto-axial region: Secondary | ICD-10-CM | POA: Diagnosis not present

## 2019-08-05 DIAGNOSIS — M9901 Segmental and somatic dysfunction of cervical region: Secondary | ICD-10-CM | POA: Diagnosis not present

## 2019-08-05 DIAGNOSIS — M5411 Radiculopathy, occipito-atlanto-axial region: Secondary | ICD-10-CM | POA: Diagnosis not present

## 2019-08-06 DIAGNOSIS — M5411 Radiculopathy, occipito-atlanto-axial region: Secondary | ICD-10-CM | POA: Diagnosis not present

## 2019-08-06 DIAGNOSIS — M9901 Segmental and somatic dysfunction of cervical region: Secondary | ICD-10-CM | POA: Diagnosis not present

## 2019-08-07 DIAGNOSIS — M5411 Radiculopathy, occipito-atlanto-axial region: Secondary | ICD-10-CM | POA: Diagnosis not present

## 2019-08-07 DIAGNOSIS — M9901 Segmental and somatic dysfunction of cervical region: Secondary | ICD-10-CM | POA: Diagnosis not present

## 2019-08-12 ENCOUNTER — Ambulatory Visit: Payer: Medicare Other

## 2019-08-19 DIAGNOSIS — M9901 Segmental and somatic dysfunction of cervical region: Secondary | ICD-10-CM | POA: Diagnosis not present

## 2019-08-19 DIAGNOSIS — M5411 Radiculopathy, occipito-atlanto-axial region: Secondary | ICD-10-CM | POA: Diagnosis not present

## 2019-08-21 ENCOUNTER — Other Ambulatory Visit: Payer: Self-pay

## 2019-08-21 ENCOUNTER — Ambulatory Visit
Admission: RE | Admit: 2019-08-21 | Discharge: 2019-08-21 | Disposition: A | Discharge status: Home / Self Care | Payer: Medicare Other | Source: Ambulatory Visit | Attending: Obstetrics and Gynecology | Admitting: Obstetrics and Gynecology

## 2019-08-21 DIAGNOSIS — M5411 Radiculopathy, occipito-atlanto-axial region: Secondary | ICD-10-CM | POA: Diagnosis not present

## 2019-08-21 DIAGNOSIS — M9901 Segmental and somatic dysfunction of cervical region: Secondary | ICD-10-CM | POA: Diagnosis not present

## 2019-08-21 DIAGNOSIS — Z1231 Encounter for screening mammogram for malignant neoplasm of breast: Secondary | ICD-10-CM | POA: Diagnosis not present

## 2019-08-26 DIAGNOSIS — M5411 Radiculopathy, occipito-atlanto-axial region: Secondary | ICD-10-CM | POA: Diagnosis not present

## 2019-08-26 DIAGNOSIS — M9901 Segmental and somatic dysfunction of cervical region: Secondary | ICD-10-CM | POA: Diagnosis not present

## 2019-08-28 DIAGNOSIS — M9901 Segmental and somatic dysfunction of cervical region: Secondary | ICD-10-CM | POA: Diagnosis not present

## 2019-08-28 DIAGNOSIS — M5411 Radiculopathy, occipito-atlanto-axial region: Secondary | ICD-10-CM | POA: Diagnosis not present

## 2019-09-02 DIAGNOSIS — M5411 Radiculopathy, occipito-atlanto-axial region: Secondary | ICD-10-CM | POA: Diagnosis not present

## 2019-09-02 DIAGNOSIS — M9901 Segmental and somatic dysfunction of cervical region: Secondary | ICD-10-CM | POA: Diagnosis not present

## 2019-09-03 DIAGNOSIS — L570 Actinic keratosis: Secondary | ICD-10-CM | POA: Diagnosis not present

## 2019-09-03 DIAGNOSIS — L821 Other seborrheic keratosis: Secondary | ICD-10-CM | POA: Diagnosis not present

## 2019-09-04 DIAGNOSIS — M9901 Segmental and somatic dysfunction of cervical region: Secondary | ICD-10-CM | POA: Diagnosis not present

## 2019-09-04 DIAGNOSIS — M5411 Radiculopathy, occipito-atlanto-axial region: Secondary | ICD-10-CM | POA: Diagnosis not present

## 2019-09-09 DIAGNOSIS — M9901 Segmental and somatic dysfunction of cervical region: Secondary | ICD-10-CM | POA: Diagnosis not present

## 2019-09-09 DIAGNOSIS — M5411 Radiculopathy, occipito-atlanto-axial region: Secondary | ICD-10-CM | POA: Diagnosis not present

## 2019-09-16 DIAGNOSIS — M5411 Radiculopathy, occipito-atlanto-axial region: Secondary | ICD-10-CM | POA: Diagnosis not present

## 2019-09-16 DIAGNOSIS — M9901 Segmental and somatic dysfunction of cervical region: Secondary | ICD-10-CM | POA: Diagnosis not present

## 2019-11-12 DIAGNOSIS — M9901 Segmental and somatic dysfunction of cervical region: Secondary | ICD-10-CM | POA: Diagnosis not present

## 2019-11-12 DIAGNOSIS — L720 Epidermal cyst: Secondary | ICD-10-CM | POA: Diagnosis not present

## 2019-11-12 DIAGNOSIS — M5411 Radiculopathy, occipito-atlanto-axial region: Secondary | ICD-10-CM | POA: Diagnosis not present

## 2019-11-12 DIAGNOSIS — L570 Actinic keratosis: Secondary | ICD-10-CM | POA: Diagnosis not present

## 2019-11-14 ENCOUNTER — Ambulatory Visit: Payer: Medicare Other

## 2019-11-21 ENCOUNTER — Ambulatory Visit: Payer: Medicare Other | Attending: Internal Medicine

## 2019-11-21 DIAGNOSIS — Z23 Encounter for immunization: Secondary | ICD-10-CM | POA: Insufficient documentation

## 2019-11-21 NOTE — Progress Notes (Signed)
Patient got first does of vaccine she reports feeling light headed but fine. She waited her 15 minutes and is fine.

## 2019-12-01 ENCOUNTER — Ambulatory Visit: Payer: Medicare Other

## 2019-12-04 DIAGNOSIS — M9903 Segmental and somatic dysfunction of lumbar region: Secondary | ICD-10-CM | POA: Diagnosis not present

## 2019-12-04 DIAGNOSIS — M5431 Sciatica, right side: Secondary | ICD-10-CM | POA: Diagnosis not present

## 2019-12-05 ENCOUNTER — Other Ambulatory Visit: Payer: Self-pay

## 2019-12-05 ENCOUNTER — Emergency Department (HOSPITAL_COMMUNITY): Payer: Medicare Other

## 2019-12-05 ENCOUNTER — Emergency Department (HOSPITAL_COMMUNITY)
Admission: EM | Admit: 2019-12-05 | Discharge: 2019-12-05 | Disposition: A | Discharge status: Home / Self Care | Payer: Medicare Other | Attending: Emergency Medicine | Admitting: Emergency Medicine

## 2019-12-05 ENCOUNTER — Encounter (HOSPITAL_COMMUNITY): Payer: Self-pay | Admitting: Emergency Medicine

## 2019-12-05 DIAGNOSIS — M542 Cervicalgia: Secondary | ICD-10-CM

## 2019-12-05 DIAGNOSIS — Z79899 Other long term (current) drug therapy: Secondary | ICD-10-CM | POA: Insufficient documentation

## 2019-12-05 LAB — COMPREHENSIVE METABOLIC PANEL
ALT: 17 U/L (ref 0–44)
AST: 22 U/L (ref 15–41)
Albumin: 4 g/dL (ref 3.5–5.0)
Alkaline Phosphatase: 50 U/L (ref 38–126)
Anion gap: 10 (ref 5–15)
BUN: 25 mg/dL — ABNORMAL HIGH (ref 8–23)
CO2: 27 mmol/L (ref 22–32)
Calcium: 9.6 mg/dL (ref 8.9–10.3)
Chloride: 102 mmol/L (ref 98–111)
Creatinine, Ser: 0.88 mg/dL (ref 0.44–1.00)
GFR calc Af Amer: >60 mL/min (ref >60)
GFR calc non Af Amer: >60 mL/min (ref >60)
Glucose, Bld: 112 mg/dL — ABNORMAL HIGH (ref 70–99)
Potassium: 4.1 mmol/L (ref 3.5–5.1)
Sodium: 139 mmol/L (ref 135–145)
Total Bilirubin: 0.9 mg/dL (ref 0.3–1.2)
Total Protein: 7.2 g/dL (ref 6.5–8.1)

## 2019-12-05 LAB — CBC WITH DIFFERENTIAL/PLATELET
Abs Immature Granulocytes: 0.03 10*3/uL (ref 0.00–0.07)
Basophils Absolute: 0.1 10*3/uL (ref 0.0–0.1)
Basophils Relative: 1 %
Eosinophils Absolute: 0.3 10*3/uL (ref 0.0–0.5)
Eosinophils Relative: 3 %
HCT: 38.1 % (ref 36.0–46.0)
Hemoglobin: 12.2 g/dL (ref 12.0–15.0)
Immature Granulocytes: 0 %
Lymphocytes Relative: 29 %
Lymphs Abs: 2.3 10*3/uL (ref 0.7–4.0)
MCH: 29.3 pg (ref 26.0–34.0)
MCHC: 32 g/dL (ref 30.0–36.0)
MCV: 91.6 fL (ref 80.0–100.0)
Monocytes Absolute: 0.9 10*3/uL (ref 0.1–1.0)
Monocytes Relative: 12 %
Neutro Abs: 4.3 10*3/uL (ref 1.7–7.7)
Neutrophils Relative %: 55 %
Platelets: 211 10*3/uL (ref 150–400)
RBC: 4.16 MIL/uL (ref 3.87–5.11)
RDW: 12.8 % (ref 11.5–15.5)
WBC: 8 10*3/uL (ref 4.0–10.5)
nRBC: 0 % (ref 0.0–0.2)

## 2019-12-05 MED ORDER — IBUPROFEN 600 MG PO TABS: 600.0000 mg | ORAL_TABLET | Freq: Four times a day (QID) | ORAL | 0 refills | Status: DC | PRN

## 2019-12-05 MED ORDER — IBUPROFEN 400 MG PO TABS
600.0000 mg | ORAL_TABLET | Freq: Once | ORAL | Status: AC
  Administered 2019-12-05: 600 mg via ORAL
  Filled 2019-12-05: qty 1

## 2019-12-05 MED ORDER — METHOCARBAMOL 500 MG PO TABS
750.0000 mg | ORAL_TABLET | Freq: Once | ORAL | Status: AC
  Administered 2019-12-05: 750 mg via ORAL
  Filled 2019-12-05: qty 2

## 2019-12-05 MED ORDER — METHOCARBAMOL 500 MG PO TABS: 500.0000 mg | ORAL_TABLET | Freq: Two times a day (BID) | ORAL | 0 refills | Status: DC

## 2019-12-05 NOTE — ED Triage Notes (Signed)
Patient reports worsening neck pain/stiff onset last night , pain increases with movement , denies neck injury , patient added mild headache this afternoon , denies fever , no blurred vision /alert and oriented.

## 2019-12-05 NOTE — Discharge Instructions (Addendum)
Please return to the emergency department if you develop headache, numbness/tingling/weakness of the upper extremities, visual change, lightheadedness or feeling like you are going to pass out. Otherwise, follow up with your doctor for recheck if symptoms persist.   Take medications as prescribed.

## 2019-12-05 NOTE — ED Provider Notes (Signed)
[facility] EMERGENCY DEPARTMENT Provider Note   CSN: [id number] Arrival date & time: 12/05/19  0051     History Chief Complaint  Patient presents with  . Neck Pain    Maria Booth is a 74 y.o. female.  Patient to ED with complaint of posterior neck pain that started about 24 hours prior to arrival. She notes it was a quick onset, without injury, fall. No history of similar symptoms. She denies radiating pain or weakness/numbness in the UE's. No headache pain. She reports the pain is worse with movement, trying to lift her head off the pillow or turn. It is better when at complete rest. No dizziness, visual change, weakness.   The history is provided by the patient. No language interpreter was used.       History reviewed. No pertinent past medical history.  There are no problems to display for this patient.   Past Surgical History:  Procedure Laterality Date  . BREAST BIOPSY    . BREAST EXCISIONAL BIOPSY Left      OB History   No obstetric history on file.     No family history on file.  Social History   Tobacco Use  . Smoking status: Never Smoker  . Smokeless tobacco: Never Used  Substance Use Topics  . Alcohol use: Yes  . Drug use: Not on file    Home Medications Prior to Admission medications   Medication Sig Start Date End Date Taking? Authorizing Provider  CITALOPRAM HYDROBROMIDE PO Take by mouth.    [provider]  SIMVASTATIN PO Take by mouth.    [provider]    Allergies    Sulfa antibiotics and Zithromax [azithromycin]  Review of Systems   Review of Systems  Musculoskeletal: Positive for neck pain.       See HPI.  Skin: Negative.   Neurological: Negative.  Negative for dizziness, facial asymmetry, speech difficulty, weakness, numbness and headaches.    Physical Exam Updated Vital Signs BP 119/70 (BP Location: Right Arm)   Pulse 64   Temp 97.6 F (36.4 C) (Oral)   Resp 18   Ht 5\' 7"   (1.702 m)   Wt 71.7 kg   SpO2 98%   BMI 24.75 kg/m   Physical Exam Vitals and nursing note reviewed.  Constitutional:      General: She is not in acute distress.    Appearance: Normal appearance.  HENT:     Head: Normocephalic and atraumatic.  Neck:   Cardiovascular:     Rate and Rhythm: Normal rate.     Pulses: Normal pulses.  Pulmonary:     Effort: Pulmonary effort is normal.  Musculoskeletal:        General: Normal range of motion.     Cervical back: No erythema or rigidity. Pain with movement and muscular tenderness present. No spinous process tenderness.     Comments: No strength deficits of UE's. FROM without limitation.   Skin:    General: Skin is warm and dry.  Neurological:     Mental Status: She is alert.     Sensory: No sensory deficit.     ED Results / Procedures / Treatments   Labs (all labs ordered are listed, but only abnormal results are displayed) Labs Reviewed  COMPREHENSIVE METABOLIC PANEL - Abnormal; Notable for the following components:      Result Value   Glucose, Bld 112 (*)    BUN 25 (*)    All other components  within normal limits  CBC WITH DIFFERENTIAL/PLATELET    EKG None  Radiology DG Cervical Spine Complete  Result Date: 12/05/2019 CLINICAL DATA:  Pain and stiffness. EXAM: CERVICAL SPINE - COMPLETE 4+ VIEW COMPARISON:  None. FINDINGS: There is no displaced fracture. No dislocation. No prevertebral soft tissue swelling. Minimal degenerative changes are noted throughout the cervical spine. There is a 2 mm anterolisthesis of C5 on C6. IMPRESSION: No displaced fracture or dislocation. Minimal degenerative changes throughout the cervical spine with a 2 mm anterolisthesis of C5 on C6. Electronically Signed   By: Constance Holster M.D.   On: 12/05/2019 01:36    Procedures Procedures (including critical care time)  Medications Ordered in ED Medications - No data to display  ED Course  I have reviewed the triage vital signs and the  nursing notes.  Pertinent labs & imaging results that were available during my care of the patient were reviewed by me and considered in my medical decision making (see chart for details).    MDM Rules/Calculators/A&P                      Patient to ED with complaint of neck pain since yesterday.   No neurologic symptoms on history or deficits on exam. Pain is reproducible. No swelling. No spinal tenderness. No headache. Do not feel advanced imaging is indicated tonight.   Will treat with muscle relaxer, ibuprofen. Recommend follow up with PCP for recheck in 2-3 days.    Final Clinical Impression(s) / ED Diagnoses Final diagnoses:  None   1. Bilateral posterior neck pain  Rx / DC Orders ED Discharge Orders    None       Charlann Lange, PA-C 12/06/19 [id number]    Mesner, Corene Cornea, MD 12/06/19 [phone]

## 2019-12-16 ENCOUNTER — Ambulatory Visit: Payer: Medicare Other | Attending: Internal Medicine

## 2019-12-16 DIAGNOSIS — Z23 Encounter for immunization: Secondary | ICD-10-CM | POA: Insufficient documentation

## 2019-12-16 NOTE — Progress Notes (Signed)
   Covid-19 Vaccination Clinic  Name:  Maria Booth    MRN: [id number] DOB: [birth year]  12/16/2019  Ms. Maria Booth was observed post Covid-19 immunization for 15 minutes without incidence. She was provided with Vaccine Information Sheet and instruction to access the V-Safe system.   Ms. Maria Booth was instructed to call 911 with any severe reactions post vaccine: Marland Kitchen Difficulty breathing  . Swelling of your face and throat  . A fast heartbeat  . A bad rash all over your body  . Dizziness and weakness    Immunizations Administered    Name Date Dose VIS Date Route   Pfizer COVID-19 Vaccine 12/16/2019  1:35 PM 0.3 mL 09/27/2019 Intramuscular   Manufacturer: [place]   Lot: [id number]   [place]: [id number]

## 2020-01-09 DIAGNOSIS — M5431 Sciatica, right side: Secondary | ICD-10-CM | POA: Diagnosis not present

## 2020-01-09 DIAGNOSIS — M9903 Segmental and somatic dysfunction of lumbar region: Secondary | ICD-10-CM | POA: Diagnosis not present

## 2020-01-13 DIAGNOSIS — M5431 Sciatica, right side: Secondary | ICD-10-CM | POA: Diagnosis not present

## 2020-01-13 DIAGNOSIS — M9903 Segmental and somatic dysfunction of lumbar region: Secondary | ICD-10-CM | POA: Diagnosis not present

## 2020-01-16 DIAGNOSIS — M5431 Sciatica, right side: Secondary | ICD-10-CM | POA: Diagnosis not present

## 2020-01-16 DIAGNOSIS — M9903 Segmental and somatic dysfunction of lumbar region: Secondary | ICD-10-CM | POA: Diagnosis not present

## 2020-01-20 DIAGNOSIS — M9903 Segmental and somatic dysfunction of lumbar region: Secondary | ICD-10-CM | POA: Diagnosis not present

## 2020-01-20 DIAGNOSIS — M5431 Sciatica, right side: Secondary | ICD-10-CM | POA: Diagnosis not present

## 2020-01-22 DIAGNOSIS — M5431 Sciatica, right side: Secondary | ICD-10-CM | POA: Diagnosis not present

## 2020-01-22 DIAGNOSIS — M9903 Segmental and somatic dysfunction of lumbar region: Secondary | ICD-10-CM | POA: Diagnosis not present

## 2020-01-27 DIAGNOSIS — M9903 Segmental and somatic dysfunction of lumbar region: Secondary | ICD-10-CM | POA: Diagnosis not present

## 2020-01-27 DIAGNOSIS — M5431 Sciatica, right side: Secondary | ICD-10-CM | POA: Diagnosis not present

## 2020-01-30 DIAGNOSIS — M5431 Sciatica, right side: Secondary | ICD-10-CM | POA: Diagnosis not present

## 2020-01-30 DIAGNOSIS — M9903 Segmental and somatic dysfunction of lumbar region: Secondary | ICD-10-CM | POA: Diagnosis not present

## 2020-02-03 DIAGNOSIS — M5431 Sciatica, right side: Secondary | ICD-10-CM | POA: Diagnosis not present

## 2020-02-03 DIAGNOSIS — M9903 Segmental and somatic dysfunction of lumbar region: Secondary | ICD-10-CM | POA: Diagnosis not present

## 2020-02-05 DIAGNOSIS — M9903 Segmental and somatic dysfunction of lumbar region: Secondary | ICD-10-CM | POA: Diagnosis not present

## 2020-02-05 DIAGNOSIS — M5431 Sciatica, right side: Secondary | ICD-10-CM | POA: Diagnosis not present

## 2020-02-10 DIAGNOSIS — M9903 Segmental and somatic dysfunction of lumbar region: Secondary | ICD-10-CM | POA: Diagnosis not present

## 2020-02-10 DIAGNOSIS — Z6824 Body mass index (BMI) 24.0-24.9, adult: Secondary | ICD-10-CM | POA: Diagnosis not present

## 2020-02-10 DIAGNOSIS — Z124 Encounter for screening for malignant neoplasm of cervix: Secondary | ICD-10-CM | POA: Diagnosis not present

## 2020-02-10 DIAGNOSIS — M5431 Sciatica, right side: Secondary | ICD-10-CM | POA: Diagnosis not present

## 2020-02-10 DIAGNOSIS — Z01419 Encounter for gynecological examination (general) (routine) without abnormal findings: Secondary | ICD-10-CM | POA: Diagnosis not present

## 2020-02-12 DIAGNOSIS — F329 Major depressive disorder, single episode, unspecified: Secondary | ICD-10-CM | POA: Diagnosis not present

## 2020-02-12 DIAGNOSIS — E7800 Pure hypercholesterolemia, unspecified: Secondary | ICD-10-CM | POA: Diagnosis not present

## 2020-02-13 DIAGNOSIS — M9903 Segmental and somatic dysfunction of lumbar region: Secondary | ICD-10-CM | POA: Diagnosis not present

## 2020-02-13 DIAGNOSIS — M5431 Sciatica, right side: Secondary | ICD-10-CM | POA: Diagnosis not present

## 2020-02-19 DIAGNOSIS — M9903 Segmental and somatic dysfunction of lumbar region: Secondary | ICD-10-CM | POA: Diagnosis not present

## 2020-02-19 DIAGNOSIS — M5431 Sciatica, right side: Secondary | ICD-10-CM | POA: Diagnosis not present

## 2020-02-26 DIAGNOSIS — M5431 Sciatica, right side: Secondary | ICD-10-CM | POA: Diagnosis not present

## 2020-02-26 DIAGNOSIS — M9903 Segmental and somatic dysfunction of lumbar region: Secondary | ICD-10-CM | POA: Diagnosis not present

## 2020-03-02 DIAGNOSIS — M5431 Sciatica, right side: Secondary | ICD-10-CM | POA: Diagnosis not present

## 2020-03-02 DIAGNOSIS — M9903 Segmental and somatic dysfunction of lumbar region: Secondary | ICD-10-CM | POA: Diagnosis not present

## 2020-03-09 DIAGNOSIS — M5431 Sciatica, right side: Secondary | ICD-10-CM | POA: Diagnosis not present

## 2020-03-09 DIAGNOSIS — M9903 Segmental and somatic dysfunction of lumbar region: Secondary | ICD-10-CM | POA: Diagnosis not present

## 2020-05-11 DIAGNOSIS — M5411 Radiculopathy, occipito-atlanto-axial region: Secondary | ICD-10-CM | POA: Diagnosis not present

## 2020-05-11 DIAGNOSIS — M9901 Segmental and somatic dysfunction of cervical region: Secondary | ICD-10-CM | POA: Diagnosis not present

## 2020-06-04 DIAGNOSIS — F329 Major depressive disorder, single episode, unspecified: Secondary | ICD-10-CM | POA: Diagnosis not present

## 2020-06-04 DIAGNOSIS — E7800 Pure hypercholesterolemia, unspecified: Secondary | ICD-10-CM | POA: Diagnosis not present

## 2020-06-16 DIAGNOSIS — M791 Myalgia, unspecified site: Secondary | ICD-10-CM | POA: Diagnosis not present

## 2020-06-16 DIAGNOSIS — Z20822 Contact with and (suspected) exposure to covid-19: Secondary | ICD-10-CM | POA: Diagnosis not present

## 2020-06-18 DIAGNOSIS — J029 Acute pharyngitis, unspecified: Secondary | ICD-10-CM | POA: Diagnosis not present

## 2020-07-20 DIAGNOSIS — Z23 Encounter for immunization: Secondary | ICD-10-CM | POA: Diagnosis not present

## 2020-09-01 DIAGNOSIS — H40023 Open angle with borderline findings, high risk, bilateral: Secondary | ICD-10-CM | POA: Diagnosis not present

## 2020-09-01 DIAGNOSIS — Z961 Presence of intraocular lens: Secondary | ICD-10-CM | POA: Diagnosis not present

## 2020-09-01 DIAGNOSIS — H04123 Dry eye syndrome of bilateral lacrimal glands: Secondary | ICD-10-CM | POA: Diagnosis not present

## 2020-09-02 DIAGNOSIS — D1801 Hemangioma of skin and subcutaneous tissue: Secondary | ICD-10-CM | POA: Diagnosis not present

## 2020-09-02 DIAGNOSIS — L720 Epidermal cyst: Secondary | ICD-10-CM | POA: Diagnosis not present

## 2020-09-02 DIAGNOSIS — L821 Other seborrheic keratosis: Secondary | ICD-10-CM | POA: Diagnosis not present

## 2020-09-29 DIAGNOSIS — F419 Anxiety disorder, unspecified: Secondary | ICD-10-CM | POA: Diagnosis not present

## 2020-09-29 DIAGNOSIS — Z0000 Encounter for general adult medical examination without abnormal findings: Secondary | ICD-10-CM | POA: Diagnosis not present

## 2020-09-29 DIAGNOSIS — Z23 Encounter for immunization: Secondary | ICD-10-CM | POA: Diagnosis not present

## 2020-09-29 DIAGNOSIS — Z1389 Encounter for screening for other disorder: Secondary | ICD-10-CM | POA: Diagnosis not present

## 2020-09-29 DIAGNOSIS — E7800 Pure hypercholesterolemia, unspecified: Secondary | ICD-10-CM | POA: Diagnosis not present

## 2020-09-29 DIAGNOSIS — Z6825 Body mass index (BMI) 25.0-25.9, adult: Secondary | ICD-10-CM | POA: Diagnosis not present

## 2020-10-13 ENCOUNTER — Other Ambulatory Visit: Payer: Self-pay | Admitting: Obstetrics and Gynecology

## 2020-10-13 DIAGNOSIS — Z1231 Encounter for screening mammogram for malignant neoplasm of breast: Secondary | ICD-10-CM

## 2020-11-19 ENCOUNTER — Ambulatory Visit
Admission: RE | Admit: 2020-11-19 | Discharge: 2020-11-19 | Disposition: A | Discharge status: Home / Self Care | Payer: Medicare Other | Source: Ambulatory Visit | Attending: Obstetrics and Gynecology | Admitting: Obstetrics and Gynecology

## 2020-11-19 ENCOUNTER — Other Ambulatory Visit: Payer: Self-pay

## 2020-11-19 DIAGNOSIS — Z1231 Encounter for screening mammogram for malignant neoplasm of breast: Secondary | ICD-10-CM

## 2023-01-23 ENCOUNTER — Ambulatory Visit
Admission: RE | Admit: 2023-01-23 | Discharge: 2023-01-23 | Disposition: A | Discharge status: Home / Self Care | Payer: Medicare Other | Source: Ambulatory Visit | Attending: Family Medicine | Admitting: Family Medicine

## 2023-01-23 ENCOUNTER — Other Ambulatory Visit: Payer: Self-pay | Admitting: Family Medicine

## 2023-01-23 DIAGNOSIS — R7612 Nonspecific reaction to cell mediated immunity measurement of gamma interferon antigen response without active tuberculosis: Secondary | ICD-10-CM

## 2023-01-24 ENCOUNTER — Other Ambulatory Visit: Payer: Self-pay | Admitting: Family Medicine

## 2023-01-24 DIAGNOSIS — R918 Other nonspecific abnormal finding of lung field: Secondary | ICD-10-CM

## 2023-01-24 DIAGNOSIS — R9389 Abnormal findings on diagnostic imaging of other specified body structures: Secondary | ICD-10-CM

## 2023-01-27 ENCOUNTER — Ambulatory Visit
Admission: RE | Admit: 2023-01-27 | Discharge: 2023-01-27 | Disposition: A | Discharge status: Home / Self Care | Payer: Medicare Other | Source: Ambulatory Visit | Attending: Family Medicine | Admitting: Family Medicine

## 2023-01-27 DIAGNOSIS — R918 Other nonspecific abnormal finding of lung field: Secondary | ICD-10-CM

## 2023-01-27 DIAGNOSIS — R9389 Abnormal findings on diagnostic imaging of other specified body structures: Secondary | ICD-10-CM

## 2023-01-27 MED ORDER — IOPAMIDOL (ISOVUE-300) INJECTION 61%
75.0000 mL | Freq: Once | INTRAVENOUS | Status: AC | PRN
  Administered 2023-01-27: 75 mL via INTRAVENOUS

## 2023-02-23 ENCOUNTER — Ambulatory Visit: Payer: Medicare Other | Admitting: Pulmonary Disease

## 2023-02-23 ENCOUNTER — Encounter: Payer: Self-pay | Admitting: Pulmonary Disease

## 2023-02-23 VITALS — BP 120/60 | HR 61 | Ht 67.0 in | Wt 155.6 lb

## 2023-02-23 DIAGNOSIS — R911 Solitary pulmonary nodule: Secondary | ICD-10-CM

## 2023-02-23 DIAGNOSIS — Z8611 Personal history of tuberculosis: Secondary | ICD-10-CM | POA: Diagnosis not present

## 2023-02-23 NOTE — Progress Notes (Signed)
Synopsis: Referred in May 2024 for pulmonary nodule by Tally Joe, MD  Subjective:   PATIENT ID: Maria Booth GENDER: female DOB: 1946-02-08, MRN: [id number]  Chief Complaint  Patient presents with   Consult    Pulm. nodule    This is a 77 year old female, past medical history of tuberculosis treated 50 years ago, she worked at [employer] and her boss had TB.  She was treated with 2 drug regimen.  She recently was planned to move into [employer].  Since she had a history of TB needed workup for this.  Looks like primary care doctor ordered QuantiFERON.  I do not have the results of this.  She had CT imaging of the chest which showed a new 1.3 cm left upper lobe pulmonary nodule spiculated.  Concerning for potential underlying malignancy.  Patient is lifelong non-smoker, no's malignancy history of her own, no family history of lung cancer.  There is a question of whether or not her grandfather had lung cancer but she is unsure.    Past Medical History:  Diagnosis Date   TB (pulmonary tuberculosis)      Family History  Problem Relation Age of Onset   Heart disease Father      Past Surgical History:  Procedure Laterality Date   BREAST BIOPSY     BREAST EXCISIONAL BIOPSY Left     Social History   Socioeconomic History   Marital status: Married    Spouse name: Not on file   Number of children: Not on file   Years of education: Not on file   Highest education level: Not on file  Occupational History   Not on file  Tobacco Use   Smoking status: Never   Smokeless tobacco: Never  Substance and Sexual Activity   Alcohol use: Yes   Drug use: Not on file   Sexual activity: Not on file  Other Topics Concern   Not on file  Social History Narrative   Not on file   Social Determinants of Health   Financial Resource Strain: Not on file  Food Insecurity: Not on file  Transportation Needs: Not on file  Physical Activity: Not on file  Stress: Not on file   Social Connections: Not on file  Intimate Partner Violence: Not on file     Allergies  Allergen Reactions   Sulfa Antibiotics    Zithromax [Azithromycin]      Outpatient Medications Prior to Visit  Medication Sig Dispense Refill   ALPRAZolam (XANAX) 0.5 MG tablet Take 0.25-0.5 mg by mouth daily as needed.     CITALOPRAM HYDROBROMIDE PO Take by mouth.     ibuprofen (ADVIL) 600 MG tablet Take 1 tablet (600 mg total) by mouth every 6 (six) hours as needed. 30 tablet 0   methocarbamol (ROBAXIN) 500 MG tablet Take 1 tablet (500 mg total) by mouth 2 (two) times daily. 20 tablet 0   SIMVASTATIN PO Take by mouth.     No facility-administered medications prior to visit.    Review of Systems  Constitutional:  Negative for chills, fever, malaise/fatigue and weight loss.  HENT:  Negative for hearing loss, sore throat and tinnitus.   Eyes:  Negative for blurred vision and double vision.  Respiratory:  Negative for cough, hemoptysis, sputum production, shortness of breath, wheezing and stridor.   Cardiovascular:  Negative for chest pain, palpitations, orthopnea, leg swelling and PND.  Gastrointestinal:  Negative for abdominal pain, constipation, diarrhea, heartburn, nausea and vomiting.  Genitourinary:  Negative for dysuria, hematuria and urgency.  Musculoskeletal:  Negative for joint pain and myalgias.  Skin:  Negative for itching and rash.  Neurological:  Negative for dizziness, tingling, weakness and headaches.  Endo/Heme/Allergies:  Negative for environmental allergies. Does not bruise/bleed easily.  Psychiatric/Behavioral:  Negative for depression. The patient is not nervous/anxious and does not have insomnia.   All other systems reviewed and are negative.    Objective:  Physical Exam Vitals reviewed.  Constitutional:      General: She is not in acute distress.    Appearance: She is well-developed.  HENT:     Head: Normocephalic and atraumatic.  Eyes:     General: No scleral  icterus.    Conjunctiva/sclera: Conjunctivae normal.     Pupils: Pupils are equal, round, and reactive to light.  Neck:     Vascular: No JVD.     Trachea: No tracheal deviation.  Cardiovascular:     Rate and Rhythm: Normal rate and regular rhythm.     Heart sounds: Normal heart sounds. No murmur heard. Pulmonary:     Effort: Pulmonary effort is normal. No tachypnea, accessory muscle usage or respiratory distress.     Breath sounds: No stridor. No wheezing, rhonchi or rales.  Abdominal:     General: There is no distension.     Palpations: Abdomen is soft.     Tenderness: There is no abdominal tenderness.  Musculoskeletal:        General: No tenderness.     Cervical back: Neck supple.  Lymphadenopathy:     Cervical: No cervical adenopathy.  Skin:    General: Skin is warm and dry.     Capillary Refill: Capillary refill takes less than 2 seconds.     Findings: No rash.  Neurological:     Mental Status: She is alert and oriented to person, place, and time.  Psychiatric:        Behavior: Behavior normal.      Vitals:   02/23/23 0857  BP: 120/60  Pulse: 61  SpO2: 96%  Weight: 155 lb 9.6 oz (70.6 kg)  Height: 5\' 7"  (1.702 m)   96% on RA BMI Readings from Last 3 Encounters:  02/23/23 24.37 kg/m  12/05/19 24.75 kg/m   Wt Readings from Last 3 Encounters:  02/23/23 155 lb 9.6 oz (70.6 kg)  12/05/19 158 lb (71.7 kg)     CBC    Component Value Date/Time   WBC 8.0 12/05/2019 0106   RBC 4.16 12/05/2019 0106   HGB 12.2 12/05/2019 0106   HCT 38.1 12/05/2019 0106   PLT 211 12/05/2019 0106   MCV 91.6 12/05/2019 0106   MCH 29.3 12/05/2019 0106   MCHC 32.0 12/05/2019 0106   RDW 12.8 12/05/2019 0106   LYMPHSABS 2.3 12/05/2019 0106   MONOABS 0.9 12/05/2019 0106   EOSABS 0.3 12/05/2019 0106   BASOSABS 0.1 12/05/2019 0106     Chest Imaging:  01/27/2023 CT chest: Spiculated 1.3 cm upper lobe nodule. The patient's images have been independently reviewed by me.     Pulmonary Functions Testing Results:     No data to display          FeNO:   Pathology:   Echocardiogram:   Heart Catheterization:     Assessment & Plan:     ICD-10-CM   1. Lung nodule  R91.1 NM PET Image Initial (PI) Skull Base To Thigh (F-18 FDG)    2. History of tuberculosis  Z86.11  Discussion:  This is a 77 year old female, lifelong non-smoker, history of tuberculosis greater than 50 years ago, status post 2 drug regimen treatment.  From respiratory standpoint she has no complaints no cough no sputum production no night sweats no fevers.  She found to have a left upper lobe spiculated nodule.  No other family history of malignancy.  Plan: Patient needs a nuclear medicine PET scan for further evaluation of the lesion. Also, track down her QuantiFERON results. Based on the PET scan results she needs to see me or SG, NP back in a few weeks. If the lesion is hypermetabolic on PET imaging would consider bronchoscopy biopsy. Being on the QuantiFERON results we will decide whether or not this would need to be done under negative pressure with biopsy and tissue culture. This could be just granulomatous inflammation and scarring related to her previous TB history. Otherwise we may be dealing with a primary malignancy based on radiographic appearance. Patient is agreeable to this plan. RTC approximately 3 weeks after PET complete.    Current Outpatient Medications:    ALPRAZolam (XANAX) 0.5 MG tablet, Take 0.25-0.5 mg by mouth daily as needed., Disp: , Rfl:    CITALOPRAM HYDROBROMIDE PO, Take by mouth., Disp: , Rfl:    ibuprofen (ADVIL) 600 MG tablet, Take 1 tablet (600 mg total) by mouth every 6 (six) hours as needed., Disp: 30 tablet, Rfl: 0   methocarbamol (ROBAXIN) 500 MG tablet, Take 1 tablet (500 mg total) by mouth 2 (two) times daily., Disp: 20 tablet, Rfl: 0   SIMVASTATIN PO, Take by mouth., Disp: , Rfl:    Josephine Igo, DO [facility] 02/23/2023 9:33 AM

## 2023-02-23 NOTE — Patient Instructions (Signed)
Thank you for visiting Dr. Tonia Brooms at [facility]. Today we recommend the following:  Orders Placed This Encounter  Procedures   NM PET Image Initial (PI) Skull Base To Thigh (F-18 FDG)   Return in about 3 weeks (around 03/16/2023) for with Kandice Robinsons, NP, or Dr. Tonia Brooms after PET .    Please do your part to reduce the spread of COVID-19.

## 2023-03-02 ENCOUNTER — Ambulatory Visit
Admission: RE | Admit: 2023-03-02 | Discharge: 2023-03-02 | Disposition: A | Discharge status: Home / Self Care | Payer: Medicare Other | Source: Ambulatory Visit | Attending: Pulmonary Disease | Admitting: Pulmonary Disease

## 2023-03-02 DIAGNOSIS — I2510 Atherosclerotic heart disease of native coronary artery without angina pectoris: Secondary | ICD-10-CM | POA: Diagnosis not present

## 2023-03-02 DIAGNOSIS — N200 Calculus of kidney: Secondary | ICD-10-CM | POA: Insufficient documentation

## 2023-03-02 DIAGNOSIS — R911 Solitary pulmonary nodule: Secondary | ICD-10-CM

## 2023-03-02 DIAGNOSIS — E042 Nontoxic multinodular goiter: Secondary | ICD-10-CM | POA: Insufficient documentation

## 2023-03-02 DIAGNOSIS — N83202 Unspecified ovarian cyst, left side: Secondary | ICD-10-CM | POA: Insufficient documentation

## 2023-03-02 DIAGNOSIS — I700 Atherosclerosis of aorta: Secondary | ICD-10-CM | POA: Diagnosis not present

## 2023-03-02 LAB — GLUCOSE, CAPILLARY: Glucose-Capillary: 86 mg/dL (ref 70–99)

## 2023-03-02 MED ORDER — FLUDEOXYGLUCOSE F - 18 (FDG) INJECTION
8.0000 | Freq: Once | INTRAVENOUS | Status: AC | PRN
  Administered 2023-03-02: 8.77 via INTRAVENOUS

## 2023-03-03 ENCOUNTER — Ambulatory Visit: Payer: Medicare Other | Admitting: Acute Care

## 2023-03-07 ENCOUNTER — Ambulatory Visit: Payer: Medicare Other | Admitting: Acute Care

## 2023-03-08 ENCOUNTER — Encounter: Payer: Self-pay | Admitting: Nurse Practitioner

## 2023-03-08 ENCOUNTER — Telehealth (INDEPENDENT_AMBULATORY_CARE_PROVIDER_SITE_OTHER): Payer: Medicare Other | Admitting: Nurse Practitioner

## 2023-03-08 ENCOUNTER — Telehealth: Payer: Self-pay | Admitting: Nurse Practitioner

## 2023-03-08 DIAGNOSIS — N949 Unspecified condition associated with female genital organs and menstrual cycle: Secondary | ICD-10-CM

## 2023-03-08 DIAGNOSIS — R911 Solitary pulmonary nodule: Secondary | ICD-10-CM

## 2023-03-08 DIAGNOSIS — E041 Nontoxic single thyroid nodule: Secondary | ICD-10-CM

## 2023-03-08 NOTE — Assessment & Plan Note (Signed)
Appears to be a simple cyst. Not hypermetabolic. Needs follow up in 6-12 months with [place]. She will follow up with her GYN or PCP to schedule this.

## 2023-03-08 NOTE — H&P (View-Only) (Signed)
 Patient ID: Maria Booth, female     DOB: 05/12/1946, 77 y.o.      MRN: [id number]  Chief Complaint  Patient presents with   Follow-up    PET review, no other c/o. Tb positive.     Virtual Visit via Video Note  I connected with Maria Booth on 03/08/23 at  2:30 PM EDT by a video enabled telemedicine application and verified that I am speaking with the correct person using two identifiers.  Location: Patient: Home Provider: Office   I discussed the limitations of evaluation and management by telemedicine and the availability of in person appointments. The patient expressed understanding and agreed to proceed.  History of Present Illness: 77 year old female, never smoker followed for lung nodule. She is a patient of Dr. Icard's and last seen in office 02/23/2023. Past medical history significant for TB, anxiety.   TESTS/EVENTS:  01/27/2023 CT chest with contrast: Mild with sclerosis.  No LAD.  Mild biapical pleural-parenchymal scarring.  1.3 x 1.1 cm irregular nodule in the left upper lobe, tethering to the lateral pleura and major fissure.  Adjacent 10 mm pleural-based nodule.  Scarring in the right middle lobe and lingula.  No evidence of pleural effusion.  7 mm lucency identified in the T12 vertebral body. 03/02/2023 PET scan: Hypermetabolic left upper lobe pulmonary nodule measuring 9 x 12 mm.  9 mm subpleural nodule in the lateral left upper lobe is not hypermetabolic.  Hypermetabolism of the left thyroid.  3.8 cm simple appearing left adnexal cyst.  02/23/2023: OV with Dr. Icard for consult.  History of tuberculosis, treated 50 years ago with 2 drug regimen.  Worked [employer] and her boss had TB.  Recently moved to [place].  Since she had a history of TB needed workup for this.  Primary care doctor ordered a quantiferon gold which was positive.  She is CT imaging of the chest which showed a new 1.3 cm left upper lobe pulmonary nodule, spiculated.  Concerning for  potential underlying malignancy.  Lifelong non-smoker.  Plan for PET scan for further evaluation.  May consider bronchoscopy if hypermetabolic on PET imaging.  Based on QuantiFERON results, would plan whether or not this needs to be done under negative pressure with biopsy and tissue culture.  03/08/2023: Today - follow up Patient presents today for follow-up via virtual visit to discuss PET scan results.  Hypermetabolic left upper lobe pulmonary nodule, concerning for primary bronchogenic carcinoma.  No evidence of metastatic disease.  She also had some hypermetabolic left thyroid nodules, which will need ultrasound for further evaluation.  She also had a 3.8 cm simple appearing left adnexal cyst which will also need follow-up with ultrasound in 6 to 12 months.  We reviewed her PET scan imaging and discussed possible next steps.  She continues to be asymptomatic from an infectious standpoint.  She denies any weight loss, hemoptysis, fevers, chills, night sweats.  She does not have any trouble with her breathing.  No significant cough.  Allergies  Allergen Reactions   Sulfa Antibiotics    Zithromax [Azithromycin]    Immunization History  Administered Date(s) Administered   PFIZER(Purple Top)SARS-COV-2 Vaccination 11/21/2019, 12/16/2019   Past Medical History:  Diagnosis Date   TB (pulmonary tuberculosis)     Tobacco History: Social History   Tobacco Use  Smoking Status Never  Smokeless Tobacco Never   Counseling given: Not Answered   Outpatient Medications Prior to Visit  Medication Sig Dispense   Refill   ALPRAZolam (XANAX) 0.5 MG tablet Take 0.25-0.5 mg by mouth daily as needed.     CITALOPRAM HYDROBROMIDE PO Take by mouth.     ibuprofen (ADVIL) 600 MG tablet Take 1 tablet (600 mg total) by mouth every 6 (six) hours as needed. 30 tablet 0   methocarbamol (ROBAXIN) 500 MG tablet Take 1 tablet (500 mg total) by mouth 2 (two) times daily. 20 tablet 0   SIMVASTATIN PO Take by mouth.      No facility-administered medications prior to visit.     Review of Systems:   Constitutional: No weight loss or gain, night sweats, fevers, chills, fatigue, or lassitude. HEENT: No headaches, difficulty swallowing, tooth/dental problems, or sore throat. No sneezing, itching, ear ache, nasal congestion, or post nasal drip CV:  No chest pain, orthopnea, PND, swelling in lower extremities, anasarca, dizziness, palpitations, syncope Resp: No shortness of breath with exertion or at rest. No excess mucus or change in color of mucus. No productive or non-productive. No hemoptysis. No wheezing.  No chest wall deformity GI:  No heartburn, indigestion, abdominal pain, nausea, vomiting, diarrhea, change in bowel habits, loss of appetite, bloody stools.  GU: No dysuria, change in color of urine, urgency or frequency.  No flank pain, no hematuria  Skin: No rash, lesions, ulcerations MSK:  No joint pain or swelling.   Neuro: No dizziness or lightheadedness.  Psych: No depression or increased anxiety. Mood stable.   Observations/Objective: Patient is well-developed, well-nourished in no acute distress. A&Ox3. Resting comfortably at home. Unlabored breathing. Speech is clear and coherent with logical content.   Assessment and Plan: Lung nodule Spiculated hypermetabolic LUL nodule, suspicious for primary bronchogenic carcinoma. She will need bronchoscopy with biopsy and tissue sampling for further evaluation under airborne precautions given positive quantiferon gold. Reviewed risks/benefits of procedure with pt. She was agreeable to move forward with this. Discussed with Dr. Icard- plan for bronchoscopy on 6/17.   Patient Instructions  Given your recent PET scan, you will need a bronchoscopy for further evaluation. This has been scheduled for 6/17 with Dr. Icard. Someone will contact you with further instructions.   Please have thyroid ultrasound set up with your primary care provider this week when you  see them  You will also need a follow up ultrasound in 6-12 months for a cyst near your left ovary. Your primary or gynecologist can schedule this.   Follow up 2 weeks after bronchoscopy to review results with Dr. Icard or Katie Andjela Wickes,NP, or sooner, if needed    Thyroid nodule Left hypermetabolic nodule - needs ultrasound for further evaluation. Will schedule with PCP this week. I also sent a message to Dr. Swayne.   Adnexal cyst Appears to be a simple cyst. Not hypermetabolic. Needs follow up in 6-12 months with US. She will follow up with her GYN or PCP to schedule this.      I discussed the assessment and treatment plan with the patient. The patient was provided an opportunity to ask questions and all were answered. The patient agreed with the plan and demonstrated an understanding of the instructions.   The patient was advised to call back or seek an in-person evaluation if the symptoms worsen or if the condition fails to improve as anticipated.  I provided 32 minutes of non-face-to-face time during this encounter.   Sary Bogie V Itzia Cunliffe, NP   

## 2023-03-08 NOTE — Patient Instructions (Signed)
Given your recent PET scan, you will need a bronchoscopy for further evaluation. This has been scheduled for 6/17 with Dr. Tonia Brooms. Someone will contact you with further instructions.   Please have thyroid ultrasound set up with your primary care provider this week when you see them  You will also need a follow up ultrasound in 6-12 months for a cyst near your left ovary. Your primary or gynecologist can schedule this.   Follow up 2 weeks after bronchoscopy to review results with Dr. Tonia Brooms or Philis Nettle, or sooner, if needed

## 2023-03-08 NOTE — Assessment & Plan Note (Addendum)
Left hypermetabolic nodule - needs ultrasound for further evaluation. Will schedule with PCP this week. I also sent a message to Dr. Azucena Cecil.

## 2023-03-08 NOTE — Assessment & Plan Note (Signed)
Spiculated hypermetabolic LUL nodule, suspicious for primary bronchogenic carcinoma. She will need bronchoscopy with biopsy and tissue sampling for further evaluation under airborne precautions given positive quantiferon gold. Reviewed risks/benefits of procedure with pt. She was agreeable to move forward with this. Discussed with Dr. Tonia Brooms- plan for bronchoscopy on 6/17.   Patient Instructions  Given your recent PET scan, you will need a bronchoscopy for further evaluation. This has been scheduled for 6/17 with Dr. Tonia Brooms. Someone will contact you with further instructions.   Please have thyroid ultrasound set up with your primary care provider this week when you see them  You will also need a follow up ultrasound in 6-12 months for a cyst near your left ovary. Your primary or gynecologist can schedule this.   Follow up 2 weeks after bronchoscopy to review results with Dr. Tonia Brooms or Philis Nettle, or sooner, if needed

## 2023-03-08 NOTE — Progress Notes (Signed)
Patient ID: Maria Booth, female     DOB: 11/07/1945, 77 y.o.      MRN: [id number]  Chief Complaint  Patient presents with   Follow-up    PET review, no other c/o. Tb positive.     Virtual Visit via Video Note  I connected with Maria Booth on 03/08/23 at  2:30 PM EDT by a video enabled telemedicine application and verified that I am speaking with the correct person using two identifiers.  Location: Patient: Home Provider: Office   I discussed the limitations of evaluation and management by telemedicine and the availability of in person appointments. The patient expressed understanding and agreed to proceed.  History of Present Illness: 77 year old female, never smoker followed for lung nodule. She is a patient of Dr. Myrlene Broker and last seen in office 02/23/2023. Past medical history significant for TB, anxiety.   TESTS/EVENTS:  01/27/2023 CT chest with contrast: Mild with sclerosis.  No LAD.  Mild biapical pleural-parenchymal scarring.  1.3 x 1.1 cm irregular nodule in the left upper lobe, tethering to the lateral pleura and major fissure.  Adjacent 10 mm pleural-based nodule.  Scarring in the right middle lobe and lingula.  No evidence of pleural effusion.  7 mm lucency identified in the T12 vertebral body. 03/02/2023 PET scan: Hypermetabolic left upper lobe pulmonary nodule measuring 9 x 12 mm.  9 mm subpleural nodule in the lateral left upper lobe is not hypermetabolic.  Hypermetabolism of the left thyroid.  3.8 cm simple appearing left adnexal cyst.  02/23/2023: OV with Dr. Tonia Brooms for consult.  History of tuberculosis, treated 50 years ago with 2 drug regimen.  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  Recently moved to [place].  Since she had a history of TB needed workup for this.  Primary care doctor ordered a quantiferon gold which was positive.  She is CT imaging of the chest which showed a new 1.3 cm left upper lobe pulmonary nodule, spiculated.  Concerning for  potential underlying malignancy.  Lifelong non-smoker.  Plan for PET scan for further evaluation.  May consider bronchoscopy if hypermetabolic on PET imaging.  Based on QuantiFERON results, would plan whether or not this needs to be done under negative pressure with biopsy and tissue culture.  03/08/2023: Today - follow up Patient presents today for follow-up via virtual visit to discuss PET scan results.  Hypermetabolic left upper lobe pulmonary nodule, concerning for primary bronchogenic carcinoma.  No evidence of metastatic disease.  She also had some hypermetabolic left thyroid nodules, which will need ultrasound for further evaluation.  She also had a 3.8 cm simple appearing left adnexal cyst which will also need follow-up with ultrasound in 6 to 12 months.  We reviewed her PET scan imaging and discussed possible next steps.  She continues to be asymptomatic from an infectious standpoint.  She denies any weight loss, hemoptysis, fevers, chills, night sweats.  She does not have any trouble with her breathing.  No significant cough.  Allergies  Allergen Reactions   Sulfa Antibiotics    Zithromax [Azithromycin]    Immunization History  Administered Date(s) Administered   PFIZER(Purple Top)SARS-COV-2 Vaccination 11/21/2019, 12/16/2019   Past Medical History:  Diagnosis Date   TB (pulmonary tuberculosis)     Tobacco History: Social History   Tobacco Use  Smoking Status Never  Smokeless Tobacco Never   Counseling given: Not Answered   Outpatient Medications Prior to Visit  Medication Sig Dispense  Refill   ALPRAZolam (XANAX) 0.5 MG tablet Take 0.25-0.5 mg by mouth daily as needed.     CITALOPRAM HYDROBROMIDE PO Take by mouth.     ibuprofen (ADVIL) 600 MG tablet Take 1 tablet (600 mg total) by mouth every 6 (six) hours as needed. 30 tablet 0   methocarbamol (ROBAXIN) 500 MG tablet Take 1 tablet (500 mg total) by mouth 2 (two) times daily. 20 tablet 0   SIMVASTATIN PO Take by mouth.      No facility-administered medications prior to visit.     Review of Systems:   Constitutional: No weight loss or gain, night sweats, fevers, chills, fatigue, or lassitude. HEENT: No headaches, difficulty swallowing, tooth/dental problems, or sore throat. No sneezing, itching, ear ache, nasal congestion, or post nasal drip CV:  No chest pain, orthopnea, PND, swelling in lower extremities, anasarca, dizziness, palpitations, syncope Resp: No shortness of breath with exertion or at rest. No excess mucus or change in color of mucus. No productive or non-productive. No hemoptysis. No wheezing.  No chest wall deformity GI:  No heartburn, indigestion, abdominal pain, nausea, vomiting, diarrhea, change in bowel habits, loss of appetite, bloody stools.  GU: No dysuria, change in color of urine, urgency or frequency.  No flank pain, no hematuria  Skin: No rash, lesions, ulcerations MSK:  No joint pain or swelling.   Neuro: No dizziness or lightheadedness.  Psych: No depression or increased anxiety. Mood stable.   Observations/Objective: Patient is well-developed, well-nourished in no acute distress. A&Ox3. Resting comfortably at home. Unlabored breathing. Speech is clear and coherent with logical content.   Assessment and Plan: Lung nodule Spiculated hypermetabolic LUL nodule, suspicious for primary bronchogenic carcinoma. She will need bronchoscopy with biopsy and tissue sampling for further evaluation under airborne precautions given positive quantiferon gold. Reviewed risks/benefits of procedure with pt. She was agreeable to move forward with this. Discussed with Dr. Tonia Brooms- plan for bronchoscopy on 6/17.   Patient Instructions  Given your recent PET scan, you will need a bronchoscopy for further evaluation. This has been scheduled for 6/17 with Dr. Tonia Brooms. Someone will contact you with further instructions.   Please have thyroid ultrasound set up with your primary care provider this week when you  see them  You will also need a follow up ultrasound in 6-12 months for a cyst near your left ovary. Your primary or gynecologist can schedule this.   Follow up 2 weeks after bronchoscopy to review results with Dr. Tonia Brooms or Philis Nettle, or sooner, if needed    Thyroid nodule Left hypermetabolic nodule - needs ultrasound for further evaluation. Will schedule with PCP this week. I also sent a message to Dr. Azucena Cecil.   Adnexal cyst Appears to be a simple cyst. Not hypermetabolic. Needs follow up in 6-12 months with [place]. She will follow up with her GYN or PCP to schedule this.      I discussed the assessment and treatment plan with the patient. The patient was provided an opportunity to ask questions and all were answered. The patient agreed with the plan and demonstrated an understanding of the instructions.   The patient was advised to call back or seek an in-person evaluation if the symptoms worsen or if the condition fails to improve as anticipated.  I provided 32 minutes of non-face-to-face time during this encounter.   Noemi Chapel, NP

## 2023-03-09 NOTE — Telephone Encounter (Signed)
Pt has been scheduled.  Appt info is in referral.  Will close this message.

## 2023-03-09 NOTE — Progress Notes (Signed)
Imaging was reviewed yesterday in clinic with SG, NP.  Plans for bronchoscopy.  She will need evaluation for the thyroid lesions as well as the left adnexal cyst by primary care and GYN.  Robynn Pane, do mind adding to a follow-up list to make sure that she has the thyroid nodules evaluated and a GYN appointment within the next month.  Thanks,  BLI  Josephine Igo, DO [facility] 03/09/2023 11:22 AM

## 2023-03-10 ENCOUNTER — Telehealth: Payer: Self-pay | Admitting: Pulmonary Disease

## 2023-03-10 ENCOUNTER — Other Ambulatory Visit: Payer: Self-pay | Admitting: Family Medicine

## 2023-03-10 DIAGNOSIS — E041 Nontoxic single thyroid nodule: Secondary | ICD-10-CM

## 2023-03-10 NOTE — Telephone Encounter (Signed)
Called and left VM for pt to see if she was able to get these appointments yet.     Per Dr. Tonia Brooms: Imaging was reviewed yesterday in clinic with SG, NP.  Plans for bronchoscopy.  She will need evaluation for the thyroid lesions as well as the left adnexal cyst by primary care and GYN.   Robynn Pane, do mind adding to a follow-up list to make sure that she has the thyroid nodules evaluated and a GYN appointment within the next month.   Thanks,   BLI   Josephine Igo, DO  [facility]  03/09/2023 11:22 AM

## 2023-03-14 ENCOUNTER — Other Ambulatory Visit (HOSPITAL_COMMUNITY): Payer: Medicare Other

## 2023-03-15 NOTE — Telephone Encounter (Signed)
Pt has thyroid US on 5/31. Per Katie's office note (5/22 video visit) she advised the pt should have an ovary US in 6-12 months, instructions are below in bold. Please advise if you would like me to inform the pt to have this sooner. Thanks.    You will also need a follow up ultrasound in 6-12 months for a cyst near your left ovary. Your primary or gynecologist can schedule this.

## 2023-03-17 ENCOUNTER — Ambulatory Visit
Admission: RE | Admit: 2023-03-17 | Discharge: 2023-03-17 | Disposition: A | Discharge status: Home / Self Care | Payer: Medicare Other | Source: Ambulatory Visit | Attending: Family Medicine | Admitting: Family Medicine

## 2023-03-17 DIAGNOSIS — E041 Nontoxic single thyroid nodule: Secondary | ICD-10-CM

## 2023-03-31 ENCOUNTER — Other Ambulatory Visit: Payer: Self-pay

## 2023-03-31 ENCOUNTER — Encounter (HOSPITAL_COMMUNITY): Payer: Self-pay | Admitting: Pulmonary Disease

## 2023-03-31 NOTE — Progress Notes (Signed)
Spoke with pt for pre-op call. Pt denies cardiac history, HTN and Diabetes. Pt states years ago she had TB. Her PCP recently did a quanto gold test and it's positive. Will need airborne precautions. Asked pt to wear a mask when she comes on Monday.   Shower instructions given to pt.

## 2023-04-03 ENCOUNTER — Encounter (HOSPITAL_COMMUNITY): Payer: Self-pay | Admitting: Pulmonary Disease

## 2023-04-03 ENCOUNTER — Encounter (HOSPITAL_COMMUNITY)
Admission: RE | Disposition: A | Discharge status: Home / Self Care | Payer: Self-pay | Source: Home / Self Care | Attending: Pulmonary Disease

## 2023-04-03 ENCOUNTER — Ambulatory Visit (HOSPITAL_COMMUNITY): Payer: Medicare Other | Admitting: Certified Registered Nurse Anesthetist

## 2023-04-03 ENCOUNTER — Ambulatory Visit (HOSPITAL_COMMUNITY): Payer: Medicare Other

## 2023-04-03 ENCOUNTER — Other Ambulatory Visit: Payer: Self-pay

## 2023-04-03 ENCOUNTER — Ambulatory Visit (HOSPITAL_BASED_OUTPATIENT_CLINIC_OR_DEPARTMENT_OTHER): Payer: Medicare Other | Admitting: Certified Registered Nurse Anesthetist

## 2023-04-03 ENCOUNTER — Ambulatory Visit (HOSPITAL_COMMUNITY)
Admission: RE | Admit: 2023-04-03 | Discharge: 2023-04-03 | Disposition: A | Discharge status: Home / Self Care | Payer: Medicare Other | Attending: Pulmonary Disease | Admitting: Pulmonary Disease

## 2023-04-03 DIAGNOSIS — E041 Nontoxic single thyroid nodule: Secondary | ICD-10-CM | POA: Insufficient documentation

## 2023-04-03 DIAGNOSIS — R911 Solitary pulmonary nodule: Secondary | ICD-10-CM

## 2023-04-03 DIAGNOSIS — F419 Anxiety disorder, unspecified: Secondary | ICD-10-CM | POA: Insufficient documentation

## 2023-04-03 DIAGNOSIS — Z8611 Personal history of tuberculosis: Secondary | ICD-10-CM | POA: Diagnosis not present

## 2023-04-03 HISTORY — PX: BRONCHIAL WASHINGS: SHX5105

## 2023-04-03 HISTORY — PX: BRONCHIAL BIOPSY: SHX5109

## 2023-04-03 HISTORY — PX: BRONCHIAL BRUSHINGS: SHX5108

## 2023-04-03 HISTORY — DX: Anxiety disorder, unspecified: F41.9

## 2023-04-03 HISTORY — PX: BRONCHIAL NEEDLE ASPIRATION BIOPSY: SHX5106

## 2023-04-03 LAB — AEROBIC/ANAEROBIC CULTURE W GRAM STAIN (SURGICAL/DEEP WOUND)

## 2023-04-03 LAB — CULTURE, BAL-QUANTITATIVE W GRAM STAIN

## 2023-04-03 SURGERY — BRONCHOSCOPY, WITH BIOPSY USING ELECTROMAGNETIC NAVIGATION
Anesthesia: General | Laterality: Left

## 2023-04-03 MED ORDER — LACTATED RINGERS IV SOLN: INTRAVENOUS | Status: DC

## 2023-04-03 MED ORDER — PHENYLEPHRINE 80 MCG/ML (10ML) SYRINGE FOR IV PUSH (FOR BLOOD PRESSURE SUPPORT)
PREFILLED_SYRINGE | INTRAVENOUS | Status: DC | PRN
  Administered 2023-04-03 (×2): 160 ug via INTRAVENOUS
  Administered 2023-04-03: 80 ug via INTRAVENOUS
  Administered 2023-04-03: 160 ug via INTRAVENOUS

## 2023-04-03 MED ORDER — ROCURONIUM BROMIDE 10 MG/ML (PF) SYRINGE
PREFILLED_SYRINGE | INTRAVENOUS | Status: DC | PRN
  Administered 2023-04-03: 60 mg via INTRAVENOUS

## 2023-04-03 MED ORDER — DEXAMETHASONE SODIUM PHOSPHATE 10 MG/ML IJ SOLN
INTRAMUSCULAR | Status: DC | PRN
  Administered 2023-04-03: 10 mg via INTRAVENOUS

## 2023-04-03 MED ORDER — SUGAMMADEX SODIUM 200 MG/2ML IV SOLN
INTRAVENOUS | Status: DC | PRN
  Administered 2023-04-03: 200 mg via INTRAVENOUS

## 2023-04-03 MED ORDER — ONDANSETRON HCL 4 MG/2ML IJ SOLN
INTRAMUSCULAR | Status: DC | PRN
  Administered 2023-04-03: 4 mg via INTRAVENOUS

## 2023-04-03 MED ORDER — LIDOCAINE 2% (20 MG/ML) 5 ML SYRINGE
INTRAMUSCULAR | Status: DC | PRN
  Administered 2023-04-03: 40 mg via INTRAVENOUS

## 2023-04-03 MED ORDER — FENTANYL CITRATE (PF) 100 MCG/2ML IJ SOLN
INTRAMUSCULAR | Status: AC
  Filled 2023-04-03: qty 2

## 2023-04-03 MED ORDER — CHLORHEXIDINE GLUCONATE 0.12 % MT SOLN
15.0000 mL | Freq: Once | OROMUCOSAL | Status: AC
  Administered 2023-04-03: 15 mL via OROMUCOSAL
  Filled 2023-04-03 (×2): qty 15

## 2023-04-03 MED ORDER — PROPOFOL 500 MG/50ML IV EMUL
INTRAVENOUS | Status: DC | PRN
  Administered 2023-04-03: 125 ug/kg/min via INTRAVENOUS

## 2023-04-03 MED ORDER — PROPOFOL 10 MG/ML IV BOLUS
INTRAVENOUS | Status: DC | PRN
  Administered 2023-04-03: 150 mg via INTRAVENOUS

## 2023-04-03 MED ORDER — ACETAMINOPHEN 500 MG PO TABS
1000.0000 mg | ORAL_TABLET | Freq: Once | ORAL | Status: AC
  Administered 2023-04-03: 1000 mg via ORAL
  Filled 2023-04-03: qty 2

## 2023-04-03 MED ORDER — FENTANYL CITRATE (PF) 250 MCG/5ML IJ SOLN
INTRAMUSCULAR | Status: DC | PRN
  Administered 2023-04-03: 100 ug via INTRAVENOUS

## 2023-04-03 NOTE — Anesthesia Postprocedure Evaluation (Signed)
Anesthesia Post Note  Patient: Maria Booth  Procedure(s) Performed: ROBOTIC ASSISTED NAVIGATIONAL BRONCHOSCOPY (Left) BRONCHIAL NEEDLE ASPIRATION BIOPSIES BRONCHIAL BRUSHINGS BRONCHIAL WASHINGS BRONCHIAL BIOPSIES     Patient location during evaluation: PACU Anesthesia Type: General Level of consciousness: awake and alert, patient cooperative and oriented Pain management: pain level controlled Vital Signs Assessment: post-procedure vital signs reviewed and stable Respiratory status: spontaneous breathing, nonlabored ventilation and respiratory function stable Cardiovascular status: blood pressure returned to baseline and stable Postop Assessment: no apparent nausea or vomiting, adequate PO intake and able to ambulate Anesthetic complications: yes   Encounter Notable Events  Notable Event Outcome Phase Comment  Difficult to intubate - expected  Intraprocedure Filed from anesthesia note documentation.    Last Vitals:  Vitals:   04/03/23 1321 04/03/23 1335  BP: 119/62 108/61  Pulse: (!) 56 (!) 59  Resp: 17 19  Temp:    SpO2: 93% 95%    Last Pain:  Vitals:   04/03/23 1335  TempSrc:   PainSc: 0-No pain                 Madeliene Tejera,E. Ladanian Kelter

## 2023-04-03 NOTE — Transfer of Care (Signed)
Immediate Anesthesia Transfer of Care Note  Patient: Maria Booth  Procedure(s) Performed: ROBOTIC ASSISTED NAVIGATIONAL BRONCHOSCOPY (Left) BRONCHIAL NEEDLE ASPIRATION BIOPSIES BRONCHIAL BRUSHINGS BRONCHIAL WASHINGS BRONCHIAL BIOPSIES  Patient Location: Endoscopy Unit  Anesthesia Type:General  Level of Consciousness: drowsy and patient cooperative  Airway & Oxygen Therapy: Patient Spontanous Breathing and Patient connected to nasal cannula oxygen  Post-op Assessment: Report given to RN, Post -op Vital signs reviewed and stable, and Patient moving all extremities X 4  Post vital signs: Reviewed and stable  Last Vitals:  Vitals Value Taken Time  BP    Temp    Pulse    Resp    SpO2      Last Pain:  Vitals:   04/03/23 0947  TempSrc: Oral  PainSc: 0-No pain         Complications:  Encounter Notable Events  Notable Event Outcome Phase Comment  Difficult to intubate - expected  Intraprocedure Filed from anesthesia note documentation.

## 2023-04-03 NOTE — Interval H&P Note (Signed)
History and Physical Interval Note:  04/03/2023 10:50 AM  Maria Booth  has presented today for surgery, with the diagnosis of LUNG NODULE.  The various methods of treatment have been discussed with the patient and family. After consideration of risks, benefits and other options for treatment, the patient has consented to  Procedure(s): ROBOTIC ASSISTED NAVIGATIONAL BRONCHOSCOPY (Left) as a surgical intervention.  The patient's history has been reviewed, patient examined, no change in status, stable for surgery.  I have reviewed the patient's chart and labs.  Questions were answered to the patient's satisfaction.     [name]

## 2023-04-03 NOTE — Anesthesia Preprocedure Evaluation (Addendum)
Anesthesia Evaluation  Patient identified by MRN, date of birth, ID band Patient awake    Reviewed: Allergy & Precautions, NPO status , Patient's Chart, lab work & pertinent test results  History of Anesthesia Complications Negative for: history of anesthetic complications  Airway Mallampati: IV  TM Distance: >3 FB Neck ROM: Full  Mouth opening: Limited Mouth Opening  Dental  (+) Dental Advisory Given   Pulmonary  Lung nodule H/o TB: 2 drug regimen years ago   breath sounds clear to auscultation       Cardiovascular negative cardio ROS  Rhythm:Regular Rate:Normal     Neuro/Psych   Anxiety     negative neurological ROS     GI/Hepatic negative GI ROS, Neg liver ROS,,,  Endo/Other  negative endocrine ROS    Renal/GU negative Renal ROS     Musculoskeletal   Abdominal   Peds  Hematology negative hematology ROS (+)   Anesthesia Other Findings   Reproductive/Obstetrics                             Anesthesia Physical Anesthesia Plan  ASA: 3  Anesthesia Plan: General   Post-op Pain Management: Tylenol PO (pre-op)*   Induction: Intravenous  PONV Risk Score and Plan: 3 and Ondansetron, Dexamethasone and Treatment may vary due to age or medical condition  Airway Management Planned: Oral ETT and Video Laryngoscope Planned  Additional Equipment: None  Intra-op Plan:   Post-operative Plan: Extubation in OR  Informed Consent: I have reviewed the patients History and Physical, chart, labs and discussed the procedure including the risks, benefits and alternatives for the proposed anesthesia with the patient or authorized representative who has indicated his/her understanding and acceptance.     Dental advisory given  Plan Discussed with: CRNA and Surgeon  Anesthesia Plan Comments:         Anesthesia Quick Evaluation

## 2023-04-03 NOTE — Discharge Instructions (Signed)

## 2023-04-03 NOTE — Anesthesia Procedure Notes (Signed)
Procedure Name: Intubation Date/Time: 04/03/2023 11:17 AM  Performed by: Alease Medina, CRNAPre-anesthesia Checklist: Patient identified, Emergency Drugs available, Suction available and Patient being monitored Patient Re-evaluated:Patient Re-evaluated prior to induction Oxygen Delivery Method: Circle system utilized Preoxygenation: Pre-oxygenation with 100% oxygen Induction Type: IV induction Ventilation: Mask ventilation without difficulty Laryngoscope Size: Glidescope and 3 Grade View: Grade I Tube type: Oral Tube size: 8.5 mm Number of attempts: 1 Airway Equipment and Method: Stylet and Oral airway Placement Confirmation: ETT inserted through vocal cords under direct vision, positive ETCO2 and breath sounds checked- equal and bilateral Secured at: 21 cm Tube secured with: Tape Dental Injury: Teeth and Oropharynx as per pre-operative assessment  Difficulty Due To: Difficulty was anticipated, Difficult Airway- due to anterior larynx and Difficult Airway- due to limited oral opening

## 2023-04-03 NOTE — Op Note (Signed)
Video Bronchoscopy with Robotic Assisted Bronchoscopic Navigation   Date of Operation: 04/03/2023   Pre-op Diagnosis: LUL nodule   Post-op Diagnosis: LUL nodule   Surgeon: Josephine Igo, DO   Assistants: None   Anesthesia: General endotracheal anesthesia  Operation: Flexible video fiberoptic bronchoscopy with robotic assistance and biopsies.  Estimated Blood Loss: Minimal  Complications: None  Indications and History: Maria Booth is a 77 y.o. female with history of LUL nodule . The risks, benefits, complications, treatment options and expected outcomes were discussed with the patient.  The possibilities of pneumothorax, pneumonia, reaction to medication, pulmonary aspiration, perforation of a viscus, bleeding, failure to diagnose a condition and creating a complication requiring transfusion or operation were discussed with the patient who freely signed the consent.    Description of Procedure: The patient was seen in the Preoperative Area, was examined and was deemed appropriate to proceed.  The patient was taken to [facility] endoscopy room 3, identified as Maria Booth and the procedure verified as Flexible Video Fiberoptic Bronchoscopy.  A Time Out was held and the above information confirmed.   Prior to the date of the procedure a high-resolution CT scan of the chest was performed. Utilizing ION software program a virtual tracheobronchial tree was generated to allow the creation of distinct navigation pathways to the patient's parenchymal abnormalities. After being taken to the operating room general anesthesia was initiated and the patient  was orally intubated. The video fiberoptic bronchoscope was introduced via the endotracheal tube and a general inspection was performed which showed normal right and left lung anatomy, aspiration of the bilateral mainstems was completed to remove any remaining secretions. Robotic catheter inserted into patient's endotracheal tube.   Target  #1 LUL nodule: The distinct navigation pathways prepared prior to this procedure were then utilized to navigate to patient's lesion identified on CT scan. The robotic catheter was secured into place and the vision probe was withdrawn.  Lesion location was approximated using fluoroscopy and three-dimensional cone beam CT imaging for CT-guided needle placement and for peripheral targeting. Under fluoroscopic guidance transbronchial needle brushings, transbronchial needle biopsies, and transbronchial forceps biopsies were performed to be sent for cytology and pathology. A bronchioalveolar lavage was performed in the LUL and sent for cytology.  At the end of the procedure a general airway inspection was performed and there was no evidence of active bleeding. The bronchoscope was removed.  The patient tolerated the procedure well. There was no significant blood loss and there were no obvious complications. A post-procedural chest x-ray is pending.  Samples Target #1: 1. Transbronchial needle brushings from LUL 2. Transbronchial Wang needle biopsies from LUL 3. Transbronchial forceps biopsies from LUL 4. Bronchoalveolar lavage from LUL  Plans:  The patient will be discharged from the PACU to home when recovered from anesthesia and after chest x-ray is reviewed. We will review the cytology, pathology and microbiology results with the patient when they become available. Outpatient followup will be with Josephine Igo, DO.  Josephine Igo, DO [facility] 04/03/2023 12:05 PM

## 2023-04-04 LAB — CYTOLOGY - NON PAP

## 2023-04-04 LAB — CULTURE, BAL-QUANTITATIVE W GRAM STAIN

## 2023-04-04 NOTE — Progress Notes (Signed)
Maria Booth,   Cultures no growth to date. She has an appt with you coming up.   Thanks,  BLI  Josephine Igo, DO [facility] 04/04/2023 12:26 PM

## 2023-04-04 NOTE — Progress Notes (Signed)
Katie,   She has an appt with you next week. I will be out of town. Cytology is neg for cancer cells. We are awaiting her culture results.   Thanks,  BLI  Josephine Igo, DO [facility] 04/04/2023 1:54 PM

## 2023-04-05 ENCOUNTER — Encounter (HOSPITAL_COMMUNITY): Payer: Self-pay | Admitting: Pulmonary Disease

## 2023-04-05 LAB — CULTURE, BAL-QUANTITATIVE W GRAM STAIN

## 2023-04-06 LAB — AEROBIC/ANAEROBIC CULTURE W GRAM STAIN (SURGICAL/DEEP WOUND)

## 2023-04-06 LAB — ACID FAST SMEAR (AFB, MYCOBACTERIA)
Acid Fast Smear: NEGATIVE
Acid Fast Smear: NEGATIVE

## 2023-04-06 LAB — MTB-RIF NAA W AFB CULT, NON-SPUTUM

## 2023-04-07 LAB — MTB-RIF NAA W AFB CULT, NON-SPUTUM

## 2023-04-07 LAB — AEROBIC/ANAEROBIC CULTURE W GRAM STAIN (SURGICAL/DEEP WOUND)

## 2023-04-08 LAB — AEROBIC/ANAEROBIC CULTURE W GRAM STAIN (SURGICAL/DEEP WOUND)

## 2023-04-11 ENCOUNTER — Telehealth: Payer: Self-pay | Admitting: Pulmonary Disease

## 2023-04-11 NOTE — Telephone Encounter (Signed)
PT is calling for the results of her bronc on 6/17. She has an appt to review the results tomorrow but can not keep appt unless the NP can call her, not video chat with her for that appt.  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   She asked me to cancel appt for tomorrow but I am keeping it there in case we can call rather than my chart. Her # is [phone].

## 2023-04-11 NOTE — Telephone Encounter (Signed)
Spoke to pt and she was confused about appt tomorrow. She stated she could do a phone call for her bronch results because she will be traveling and didn't have wifi and she would have to stop to get wifi. I informed her that she can do her video visit with her cell phone. Then she had to ask her husband if she could do the visit with her cell phone. And it didn't have wifi. She started to confuse me until I stated she didn't need wifi to use mychart on her cell phone to do her video visit. Then the husband was in the background saying something but  I could not hear him. But pt did verbalized understanding. And will keep her video visit appt with Rhunette Croft, NP tomorrow. Nothing further needed.

## 2023-04-12 ENCOUNTER — Telehealth (INDEPENDENT_AMBULATORY_CARE_PROVIDER_SITE_OTHER): Payer: Medicare Other | Admitting: Nurse Practitioner

## 2023-04-12 DIAGNOSIS — R911 Solitary pulmonary nodule: Secondary | ICD-10-CM | POA: Diagnosis not present

## 2023-04-12 NOTE — Progress Notes (Unsigned)
Patient ID: Maria Booth, female     DOB: 10/07/1946, 77 y.o.      MRN: [id number]  No chief complaint on file.   Virtual Visit via Video Note  I connected with Maria Booth on 04/12/23 at  3:00 PM EDT by a video enabled telemedicine application and verified that I am speaking with the correct person using two identifiers.  Location: Patient: Home Provider: Office   I discussed the limitations of evaluation and management by telemedicine and the availability of in person appointments. The patient expressed understanding and agreed to proceed.  History of Present Illness: 77 year old female, never smoker followed for lung nodule. She is a patient of Dr. Myrlene Broker and last seen in office 02/23/2023. Past medical history significant for TB, anxiety.   TESTS/EVENTS:  01/27/2023 CT chest with contrast: Mild with sclerosis.  No LAD.  Mild biapical pleural-parenchymal scarring.  1.3 x 1.1 cm irregular nodule in the left upper lobe, tethering to the lateral pleura and major fissure.  Adjacent 10 mm pleural-based nodule.  Scarring in the right middle lobe and lingula.  No evidence of pleural effusion.  7 mm lucency identified in the T12 vertebral body. 03/02/2023 PET scan: Hypermetabolic left upper lobe pulmonary nodule measuring 9 x 12 mm.  9 mm subpleural nodule in the lateral left upper lobe is not hypermetabolic.  Hypermetabolism of the left thyroid.  3.8 cm simple appearing left adnexal cyst.  02/23/2023: OV with Dr. Tonia Brooms for consult.  History of tuberculosis, treated 50 years ago with 2 drug regimen.  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  Recently moved to [place].  Since she had a history of TB needed workup for this.  Primary care doctor ordered a quantiferon gold which was positive.  She is CT imaging of the chest which showed a new 1.3 cm left upper lobe pulmonary nodule, spiculated.  Concerning for potential underlying malignancy.  Lifelong non-smoker.  Plan for PET scan  for further evaluation.  May consider bronchoscopy if hypermetabolic on PET imaging.  Based on QuantiFERON results, would plan whether or not this needs to be done under negative pressure with biopsy and tissue culture.  03/08/2023: Today - follow up Patient presents today for follow-up via virtual visit to discuss PET scan results.  Hypermetabolic left upper lobe pulmonary nodule, concerning for primary bronchogenic carcinoma.  No evidence of metastatic disease.  She also had some hypermetabolic left thyroid nodules, which will need ultrasound for further evaluation.  She also had a 3.8 cm simple appearing left adnexal cyst which will also need follow-up with ultrasound in 6 to 12 months.  We reviewed her PET scan imaging and discussed possible next steps.  She continues to be asymptomatic from an infectious standpoint.  She denies any weight loss, hemoptysis, fevers, chills, night sweats.  She does not have any trouble with her breathing.  No significant cough.  Allergies  Allergen Reactions   Sulfa Antibiotics Rash   Zithromax [Azithromycin] Rash   Immunization History  Administered Date(s) Administered   PFIZER(Purple Top)SARS-COV-2 Vaccination 11/21/2019, 12/16/2019   Past Medical History:  Diagnosis Date   Anxiety    TB (pulmonary tuberculosis)     Tobacco History: Social History   Tobacco Use  Smoking Status Never   Passive exposure: Never  Smokeless Tobacco Never   Counseling given: Not Answered   Outpatient Medications Prior to Visit  Medication Sig Dispense Refill   ALPRAZolam (XANAX) 0.5 MG  tablet Take 0.25-0.5 mg by mouth daily as needed for anxiety.     Ascorbic Acid (VITAMIN C) 1000 MG tablet Take 1,000 mg by mouth daily.     Calcium Carb-Cholecalciferol (CALCIUM + VITAMIN D3 PO) Take 1 tablet by mouth daily.     citalopram (CELEXA) 40 MG tablet Take 40 mg by mouth daily.     Multiple Vitamins-Minerals (MULTIVITAMIN WITH MINERALS) tablet Take 1 tablet by mouth daily.      Omega-3 Fatty Acids (FISH OIL) 1000 MG CAPS Take 1,000 mg by mouth daily.     simvastatin (ZOCOR) 20 MG tablet Take 20 mg by mouth every evening.     No facility-administered medications prior to visit.     Review of Systems:   Constitutional: No weight loss or gain, night sweats, fevers, chills, fatigue, or lassitude. HEENT: No headaches, difficulty swallowing, tooth/dental problems, or sore throat. No sneezing, itching, ear ache, nasal congestion, or post nasal drip CV:  No chest pain, orthopnea, PND, swelling in lower extremities, anasarca, dizziness, palpitations, syncope Resp: No shortness of breath with exertion or at rest. No excess mucus or change in color of mucus. No productive or non-productive. No hemoptysis. No wheezing.  No chest wall deformity GI:  No heartburn, indigestion, abdominal pain, nausea, vomiting, diarrhea, change in bowel habits, loss of appetite, bloody stools.  GU: No dysuria, change in color of urine, urgency or frequency.  No flank pain, no hematuria  Skin: No rash, lesions, ulcerations MSK:  No joint pain or swelling.   Neuro: No dizziness or lightheadedness.  Psych: No depression or increased anxiety. Mood stable.   Observations/Objective: Patient is well-developed, well-nourished in no acute distress. A&Ox3. Resting comfortably at home. Unlabored breathing. Speech is clear and coherent with logical content.   Assessment and Plan: No problem-specific Assessment & Plan notes found for this encounter.      I discussed the assessment and treatment plan with the patient. The patient was provided an opportunity to ask questions and all were answered. The patient agreed with the plan and demonstrated an understanding of the instructions.   The patient was advised to call back or seek an in-person evaluation if the symptoms worsen or if the condition fails to improve as anticipated.  I provided 32 minutes of non-face-to-face time during this  encounter.   Noemi Chapel, NP

## 2023-04-13 ENCOUNTER — Encounter: Payer: Self-pay | Admitting: Nurse Practitioner

## 2023-04-13 NOTE — Patient Instructions (Signed)
We will await final cultures but so far everything looks negative.  Since you had a positive PET scan, the nodule will need repeat imaging in August for 3 month follow up if all findings from bronchoscopy remain negative. Someone will contact you to schedule this.  Follow up beginning of September with Dr. Tonia Brooms to review CT scan. If symptoms worsen, please contact office for sooner follow up or seek emergency care.

## 2023-04-13 NOTE — Assessment & Plan Note (Addendum)
Spiculated hypermetabolic LUL nodule, suspicious for primary bronchogenic carcinoma. She also has a hx of treated TB with positive quantiferon gold. Bronchoscopy was ordered for further evaluation. Cytology negative. Cultures negative thus far aside from contaminant of staph epidermis. She remains asymptomatic. Will await final cultures within 6-8 weeks. She will need repeat CT chest in August for 3 month follow up if cultures remain negative.   Patient Instructions  We will await final cultures but so far everything looks negative.  Since you had a positive PET scan, the nodule will need repeat imaging in August for 3 month follow up if all findings from bronchoscopy remain negative. Someone will contact you to schedule this.  Follow up beginning of September with Dr. Tonia Brooms to review CT scan. If symptoms worsen, please contact office for sooner follow up or seek emergency care.

## 2023-05-04 LAB — FUNGAL ORGANISM REFLEX

## 2023-05-04 LAB — FUNGUS CULTURE WITH STAIN

## 2023-05-04 LAB — FUNGUS CULTURE RESULT

## 2023-06-14 ENCOUNTER — Ambulatory Visit (HOSPITAL_BASED_OUTPATIENT_CLINIC_OR_DEPARTMENT_OTHER)
Admission: RE | Admit: 2023-06-14 | Discharge: 2023-06-14 | Disposition: A | Discharge status: Home / Self Care | Payer: Medicare Other | Source: Ambulatory Visit | Attending: Nurse Practitioner | Admitting: Nurse Practitioner

## 2023-06-14 DIAGNOSIS — R911 Solitary pulmonary nodule: Secondary | ICD-10-CM | POA: Insufficient documentation

## 2023-06-26 ENCOUNTER — Other Ambulatory Visit: Payer: Self-pay | Admitting: Nurse Practitioner

## 2023-06-26 DIAGNOSIS — R911 Solitary pulmonary nodule: Secondary | ICD-10-CM

## 2023-07-12 ENCOUNTER — Telehealth: Payer: Self-pay | Admitting: Pulmonary Disease

## 2023-07-12 NOTE — Telephone Encounter (Signed)
Left message for the patietn

## 2023-07-12 NOTE — Telephone Encounter (Signed)
PT calling to resched a CT appt just made. States she is closer to [facility] screening.   [phone]  Mon and Tue not free for appt.

## 2023-08-31 NOTE — Telephone Encounter (Signed)
nfn

## 2023-12-21 ENCOUNTER — Ambulatory Visit
Admission: RE | Admit: 2023-12-21 | Discharge: 2023-12-21 | Disposition: A | Discharge status: Home / Self Care | Payer: Medicare Other | Source: Ambulatory Visit | Attending: Nurse Practitioner | Admitting: Nurse Practitioner

## 2023-12-21 DIAGNOSIS — R911 Solitary pulmonary nodule: Secondary | ICD-10-CM

## 2023-12-25 ENCOUNTER — Other Ambulatory Visit: Payer: Medicare Other

## 2024-01-15 NOTE — Progress Notes (Signed)
 Stable nodule but still needs dedicated follow up. Please order repeat CT chest in 6 months and schedule new pt visit with Dr Delton Coombes (former [facility] pt) Thanks.

## 2024-01-17 ENCOUNTER — Other Ambulatory Visit: Payer: Self-pay

## 2024-01-18 ENCOUNTER — Other Ambulatory Visit: Payer: Self-pay

## 2024-01-18 DIAGNOSIS — R911 Solitary pulmonary nodule: Secondary | ICD-10-CM

## 2024-01-25 NOTE — Progress Notes (Signed)
 I called and spoke with the pt and notified of results from [place]. She verbalized understanding. I updated her CT order to be done at [facility].per pt request.

## 2024-03-18 ENCOUNTER — Other Ambulatory Visit (HOSPITAL_BASED_OUTPATIENT_CLINIC_OR_DEPARTMENT_OTHER): Payer: Self-pay | Admitting: Family Medicine

## 2024-03-18 DIAGNOSIS — E041 Nontoxic single thyroid nodule: Secondary | ICD-10-CM

## 2024-04-15 ENCOUNTER — Ambulatory Visit (HOSPITAL_BASED_OUTPATIENT_CLINIC_OR_DEPARTMENT_OTHER): Admission: RE | Admit: 2024-04-15 | Source: Ambulatory Visit

## 2024-04-22 ENCOUNTER — Ambulatory Visit (HOSPITAL_BASED_OUTPATIENT_CLINIC_OR_DEPARTMENT_OTHER)
Admission: RE | Admit: 2024-04-22 | Discharge: 2024-04-22 | Disposition: A | Discharge status: Home / Self Care | Source: Ambulatory Visit | Attending: Family Medicine | Admitting: Family Medicine

## 2024-04-22 DIAGNOSIS — E041 Nontoxic single thyroid nodule: Secondary | ICD-10-CM | POA: Diagnosis present

## 2024-04-24 ENCOUNTER — Other Ambulatory Visit: Payer: Self-pay | Admitting: Family Medicine

## 2024-04-24 DIAGNOSIS — E042 Nontoxic multinodular goiter: Secondary | ICD-10-CM

## 2024-04-30 ENCOUNTER — Ambulatory Visit
Admission: RE | Admit: 2024-04-30 | Discharge: 2024-04-30 | Disposition: A | Discharge status: Home / Self Care | Source: Ambulatory Visit | Attending: Family Medicine | Admitting: Family Medicine

## 2024-04-30 ENCOUNTER — Other Ambulatory Visit (HOSPITAL_COMMUNITY)
Admission: RE | Admit: 2024-04-30 | Discharge: 2024-04-30 | Disposition: A | Discharge status: Home / Self Care | Source: Ambulatory Visit | Attending: Radiology | Admitting: Radiology

## 2024-04-30 DIAGNOSIS — E042 Nontoxic multinodular goiter: Secondary | ICD-10-CM

## 2024-04-30 DIAGNOSIS — E041 Nontoxic single thyroid nodule: Secondary | ICD-10-CM | POA: Diagnosis present

## 2024-05-02 LAB — CYTOLOGY - NON PAP

## 2024-05-22 ENCOUNTER — Telehealth: Payer: Self-pay

## 2024-05-22 NOTE — Telephone Encounter (Signed)
 Pt has been scheduled and mailing a reminder letter for her CT in October. Had to LVM. NFN

## 2024-05-22 NOTE — Telephone Encounter (Signed)
 Copied from CRM #[id number]. Topic: Clinical - Medical Advice >> May 21, 2024  8:56 AM Isabell A wrote: Reason for CRM: Patient would like to confirm if she needs to have a cat scan before setting up her appointment with Dr.Byrum.   Callback number: [phone]   ATC X1. LMTCB   Pt would need to complete the CT scan before she follows up with the provider. The f/u appt would need to be at least 2-3 weeks after the CT is completed so the provider has time to receive and review the results.

## 2024-05-22 NOTE — Telephone Encounter (Unsigned)
 Copied from CRM #[id number]. Topic: Clinical - Medical Advice >> May 22, 2024  2:29 PM Chantha C wrote: Patient is returning the office/Ashlyn's call. Asked patient is she listened to Ashlyn's message from today 05/22/24. Patient states the message was just to call Ashlyn, no details message was left. Warm transferred to CMA.

## 2024-05-22 NOTE — Telephone Encounter (Signed)
 Called and spoke with Maria Booth. I have clarified per [facility] Stable nodule but still needs dedicated follow up. Please order repeat CT chest in 6 months and schedule new pt visit with Dr Shelah (former [facility] pt) Thanks.  Pt is aware to have CT in October and to scheduled a new patient appt with RB to review. Pt verbalized understanding and had no further concerns.  Routing to [facility] to schedule CT

## 2024-05-23 NOTE — Telephone Encounter (Signed)
 I called and spoke to pt. Pt states that she had her CT schedule changed to 07/16/24 and she needed a f/u appt with Dr Shelah. I have pt scheduled for 08/02/24 at 9:15 am. Pt verbalized understanding. NFN

## 2024-07-16 ENCOUNTER — Ambulatory Visit (HOSPITAL_BASED_OUTPATIENT_CLINIC_OR_DEPARTMENT_OTHER)
Admission: RE | Admit: 2024-07-16 | Discharge: 2024-07-16 | Disposition: A | Discharge status: Home / Self Care | Source: Ambulatory Visit | Attending: Nurse Practitioner | Admitting: Nurse Practitioner

## 2024-07-16 DIAGNOSIS — R911 Solitary pulmonary nodule: Secondary | ICD-10-CM | POA: Insufficient documentation

## 2024-07-19 ENCOUNTER — Other Ambulatory Visit (HOSPITAL_BASED_OUTPATIENT_CLINIC_OR_DEPARTMENT_OTHER)

## 2024-07-25 ENCOUNTER — Ambulatory Visit: Payer: Self-pay | Admitting: Nurse Practitioner

## 2024-07-25 NOTE — Progress Notes (Signed)
 No significant change in CT chest when compared to 12/2023. She has an appt with Dr. Shelah next week to discuss CT and next steps. Thanks.

## 2024-07-25 NOTE — Progress Notes (Signed)
 Called and spoke to pt - advised of CT results per [facility]. Pt verbalized understanding, NFN.

## 2024-08-02 ENCOUNTER — Encounter: Payer: Self-pay | Admitting: Emergency Medicine

## 2024-08-02 ENCOUNTER — Ambulatory Visit: Admitting: Emergency Medicine

## 2024-08-02 VITALS — BP 112/70 | HR 62 | Temp 98.0°F | Ht 66.0 in | Wt 159.0 lb

## 2024-08-02 DIAGNOSIS — R911 Solitary pulmonary nodule: Secondary | ICD-10-CM | POA: Diagnosis not present

## 2024-08-02 NOTE — Patient Instructions (Signed)
  VISIT SUMMARY: Today, we discussed the follow-up of your pulmonary nodule. The nodule has decreased in size and remains stable, with a low suspicion of malignancy. We also reviewed your history of treated tuberculosis and its potential impact on your current condition.  YOUR PLAN: -LEFT UPPER LOBE PULMONARY NODULE: A pulmonary nodule is a small, round growth in the lung. Your nodule has decreased in size and is stable, which means it is less likely to be cancerous and may be a scar from your past tuberculosis. We will order a repeat CT scan in April 2026 to check its stability. If the nodule remains stable, we will discontinue dedicated CT surveillance unless you develop new symptoms such as coughing, difficulty breathing, sputum production, fevers, chills, sweats, malaise, or fatigue.  -HISTORY OF TREATED TUBERCULOSIS: Tuberculosis is an infectious disease that primarily affects the lungs. You were treated for tuberculosis about 50 years ago, and the current nodule is likely a residual scar from that infection. There is a low likelihood of the tuberculosis reactivating. Please monitor for any symptoms such as fevers, chills, sweats, malaise, and fatigue.  INSTRUCTIONS: Please schedule a repeat CT scan for April 2026 to assess the stability of the pulmonary nodule. Continue to monitor for any new symptoms and report them to us  immediately.

## 2024-08-02 NOTE — Progress Notes (Signed)
 Subjective:    Patient ID: Maria Booth, female    DOB: December 29, 1945, 78 y.o.   MRN: [id number]  HPI Discussed the use of AI scribe software for clinical note transcription with the patient, who gave verbal consent to proceed.  History of Present Illness Maria Booth is a 78 year old female who presents for follow-up of a pulmonary nodule.  She has a history of a 1.3 cm left upper lobe pulmonary nodule identified on a CT scan. A PET scan on Mar 02, 2023, showed the nodule was hypermetabolic. A bronchoscopy on April 03, 2023, was negative for malignancy, showing benign bronchial cells, pulmonary macrophages, and chronic inflammatory cells. Subsequent imaging on June 14, 2023, showed a slight decrease in the nodule size to 1.1 x 0.7 x 1.0 cm. The most recent CT on July 16, 2024, was performed.  She has a history of tuberculosis treated approximately 50 years ago, with a positive skin test and exposure, leading to treatment with public health involvement for about a year and a half. She does not remember which side had a scar from the infection.  No current symptoms such as breathing difficulty, coughing, fevers, chills, sweats, malaise, or fatigue. She mentions a slightly drippy nose since fall, for which she took one dose of medication one night.  She takes simvastatin for cholesterol management. She has no history of coronary artery disease and has never seen a cardiologist. A recent CT scan was performed.  She had hypermetabolic left thyroid  nodules, which were biopsied in July 2025 and found to be benign follicular nodules.    Results I reviewed all of her CT scans of the chest and personally interpreted.  Also reviewed her bronchoscopy results, thyroid  nodule biopsy with her  RADIOLOGY Chest CT: 1.3 cm left upper lobe pulmonary nodule hypermetabolic; 9 mm subpleural left upper lobe nodule non-hypermetabolic; hypermetabolic left thyroid  nodules (03/02/2023) Chest CT:  Left upper lobe pulmonary nodule decreased to 1.1 x 0.7 x 1.0 cm; lateral left upper lobe plaque-like nodule unchanged (06/14/2023) Chest CT: No metastatic or hilar adenopathy; elongated nodular structure in left upper lobe unchanged; no new suspicious pulmonary nodules or lesions (07/16/2024)  DIAGNOSTIC Bronchoscopy: Negative for malignancy; benign bronchial cells, pulmonary macrophages, chronic inflammatory cells (04/03/2023)  PATHOLOGY Thyroid  Biopsy: Benign follicular nodule (04/30/2024)    Review of Systems As per HPI  Past Medical History:  Diagnosis Date   Anxiety    TB (pulmonary tuberculosis)     Family History  Problem Relation Age of Onset   Heart disease Father      Social History   Socioeconomic History   Marital status: Married    Spouse name: Not on file   Number of children: Not on file   Years of education: Not on file   Highest education level: Not on file  Occupational History   Not on file  Tobacco Use   Smoking status: Never    Passive exposure: Never   Smokeless tobacco: Never  Vaping Use   Vaping status: Never Used  Substance and Sexual Activity   Alcohol use: Yes    Comment: 1 glass a week   Drug use: Never   Sexual activity: Not on file  Other Topics Concern   Not on file  Social History Narrative   Not on file   Social Drivers of Health   Financial Resource Strain: Not on file  Food Insecurity: Not on file  Transportation Needs: Not on file  Physical  Activity: Not on file  Stress: Not on file  Social Connections: Not on file  Intimate Partner Violence: Not on file    Allergies  Allergen Reactions   Sulfa Antibiotics Rash   Zithromax [Azithromycin] Rash    Current Outpatient Medications on File Prior to Visit  Medication Sig Dispense Refill   ALPRAZolam (XANAX) 0.5 MG tablet Take 0.25-0.5 mg by mouth daily as needed for anxiety.     Ascorbic Acid (VITAMIN C) 1000 MG tablet Take 1,000 mg by mouth daily.     Calcium  Carb-Cholecalciferol (CALCIUM + VITAMIN D3 PO) Take 1 tablet by mouth daily.     calcium carbonate (SUPER CALCIUM) 1500 (600 Ca) MG TABS tablet Take 600 mg of elemental calcium by mouth daily with breakfast.     citalopram (CELEXA) 40 MG tablet Take 40 mg by mouth daily.     melatonin 5 MG TABS Take 5 mg by mouth at bedtime.     Multiple Vitamins-Minerals (MULTIVITAMIN WITH MINERALS) tablet Take 1 tablet by mouth daily.     Omega-3 Fatty Acids (FISH OIL) 1000 MG CAPS Take 1,000 mg by mouth daily.     polycarbophil (FIBERCON) 625 MG tablet Take 625 mg by mouth daily.     simvastatin (ZOCOR) 20 MG tablet Take 20 mg by mouth every evening.     No current facility-administered medications on file prior to visit.       Objective:    Vitals:   08/02/24 0908  BP: 112/70  Pulse: 62  Temp: 98 F (36.7 C)  SpO2: 99%  Weight: 159 lb (72.1 kg)  Height: 5' 6 (1.676 m)   Physical Exam Gen: Pleasant, well-nourished, in no distress,  normal affect  ENT: No lesions,  mouth clear,  oropharynx clear, no postnasal drip  Neck: No JVD, no stridor  Lungs: No use of accessory muscles, no crackles or wheezing on normal respiration, no wheeze on forced expiration  Cardiovascular: RRR, heart sounds normal, no murmur or gallops, no peripheral edema  Musculoskeletal: No deformities, no cyanosis or clubbing  Neuro: alert, awake, non focal  Skin: Warm, no lesions or rashes       Assessment & Plan:   Assessment & Plan Lung nodule   Assessment and Plan Assessment & Plan Left upper lobe pulmonary nodule, stable, under surveillance.  Pathology was negative on bronchoscopy from 04/03/2023 (Dr Brenna) Nodule decreased in size, stable on imaging, low suspicion of malignancy, likely a scar from past tuberculosis. - Order repeat CT scan in April 2026 to assess nodule stability. - If CT scan in April 2026 shows stability, discontinue dedicated CT surveillance unless clinical changes occur. - Monitor  for symptoms such as coughing, dyspnea, sputum production, fevers, chills, sweats, malaise, or fatigue.  History of treated tuberculosis Treated 50 years ago, nodule likely a residual scar, low likelihood of reactivation. - Monitor for symptoms such as fevers, chills, sweats, malaise, and fatigue.   No follow-ups on file.   Time spent 34 minutes reviewing patient's scans, history, test results and making plan of care.   Lamar Chris, MD, PhD 08/02/2024, 9:43 AM [facility] [phone] or if no answer before 7:00PM call [phone] For any issues after 7:00PM please call eLink [phone]

## 2024-08-08 ENCOUNTER — Ambulatory Visit (HOSPITAL_BASED_OUTPATIENT_CLINIC_OR_DEPARTMENT_OTHER)
